# Patient Record
Sex: Male | Born: 1970 | Race: Black or African American | Hispanic: No | Marital: Single | Smoking: Current every day smoker
Health system: Southern US, Community
[De-identification: ages and names within clinical notes are randomized; demographics above are authoritative.]

## PROBLEM LIST (undated history)

## (undated) DIAGNOSIS — K2270 Barrett's esophagus without dysplasia: Secondary | ICD-10-CM

## (undated) DIAGNOSIS — K2100 Gastro-esophageal reflux disease with esophagitis, without bleeding: Secondary | ICD-10-CM

## (undated) DIAGNOSIS — B9681 Helicobacter pylori [H. pylori] as the cause of diseases classified elsewhere: Secondary | ICD-10-CM

## (undated) DIAGNOSIS — K2970 Gastritis, unspecified, without bleeding: Secondary | ICD-10-CM

## (undated) DIAGNOSIS — R569 Unspecified convulsions: Secondary | ICD-10-CM

## (undated) DIAGNOSIS — I10 Essential (primary) hypertension: Secondary | ICD-10-CM

## (undated) DIAGNOSIS — K219 Gastro-esophageal reflux disease without esophagitis: Secondary | ICD-10-CM

## (undated) HISTORY — DX: Gastro-esophageal reflux disease with esophagitis, without bleeding: K21.00

## (undated) HISTORY — DX: Essential (primary) hypertension: I10

## (undated) HISTORY — DX: Unspecified convulsions: R56.9

## (undated) HISTORY — DX: Barrett's esophagus without dysplasia: K22.70

## (undated) HISTORY — DX: Gastritis, unspecified, without bleeding: K29.70

## (undated) HISTORY — DX: Helicobacter pylori (H. pylori) as the cause of diseases classified elsewhere: B96.81

## (undated) HISTORY — DX: Gastro-esophageal reflux disease with esophagitis: K21.0

---

## 2010-09-01 ENCOUNTER — Encounter: Payer: Self-pay | Admitting: *Deleted

## 2010-09-01 ENCOUNTER — Emergency Department (HOSPITAL_COMMUNITY): Payer: BC Managed Care – PPO

## 2010-09-01 ENCOUNTER — Emergency Department (HOSPITAL_COMMUNITY)
Admission: EM | Admit: 2010-09-01 | Discharge: 2010-09-01 | Disposition: A | Discharge status: Home / Self Care | Payer: BC Managed Care – PPO | Attending: Emergency Medicine | Admitting: Emergency Medicine

## 2010-09-01 DIAGNOSIS — R109 Unspecified abdominal pain: Secondary | ICD-10-CM | POA: Insufficient documentation

## 2010-09-01 DIAGNOSIS — F17200 Nicotine dependence, unspecified, uncomplicated: Secondary | ICD-10-CM | POA: Insufficient documentation

## 2010-09-01 DIAGNOSIS — R1031 Right lower quadrant pain: Secondary | ICD-10-CM | POA: Insufficient documentation

## 2010-09-01 DIAGNOSIS — R197 Diarrhea, unspecified: Secondary | ICD-10-CM | POA: Insufficient documentation

## 2010-09-01 DIAGNOSIS — K625 Hemorrhage of anus and rectum: Secondary | ICD-10-CM | POA: Insufficient documentation

## 2010-09-01 LAB — COMPREHENSIVE METABOLIC PANEL
ALT: 12 U/L (ref 0–53)
AST: 19 U/L (ref 0–37)
Albumin: 4 g/dL (ref 3.5–5.2)
Alkaline Phosphatase: 96 U/L (ref 39–117)
BUN: 11 mg/dL (ref 6–23)
Potassium: 3.7 mEq/L (ref 3.5–5.1)
Sodium: 136 mEq/L (ref 135–145)
Total Protein: 7.2 g/dL (ref 6.0–8.3)

## 2010-09-01 LAB — CBC
HCT: 45 % (ref 39.0–52.0)
MCV: 96.6 fL (ref 78.0–100.0)
RDW: 12.7 % (ref 11.5–15.5)
WBC: 5.2 10*3/uL (ref 4.0–10.5)

## 2010-09-01 LAB — URINALYSIS, ROUTINE W REFLEX MICROSCOPIC
Glucose, UA: NEGATIVE mg/dL
Hgb urine dipstick: NEGATIVE
Protein, ur: NEGATIVE mg/dL
Specific Gravity, Urine: 1.015 (ref 1.005–1.030)

## 2010-09-01 MED ORDER — ONDANSETRON HCL 4 MG/2ML IJ SOLN
4.0000 mg | Freq: Once | INTRAMUSCULAR | Status: AC
  Administered 2010-09-01: 4 mg via INTRAVENOUS
  Filled 2010-09-01: qty 2

## 2010-09-01 MED ORDER — IOHEXOL 300 MG/ML  SOLN
100.0000 mL | Freq: Once | INTRAMUSCULAR | Status: AC | PRN
  Administered 2010-09-01: 100 mL via INTRAVENOUS

## 2010-09-01 MED ORDER — SODIUM CHLORIDE 0.9 % IV BOLUS (SEPSIS)
250.0000 mL | Freq: Once | INTRAVENOUS | Status: AC
  Administered 2010-09-01: 250 mL via INTRAVENOUS

## 2010-09-01 MED ORDER — SODIUM CHLORIDE 0.9 % IV SOLN: INTRAVENOUS | Status: DC

## 2010-09-01 NOTE — ED Notes (Signed)
Pt c/o blood in his stool, started a week ago, has gotten worse,  Pt describes color of blood as bright red most of the time sometimes it seems dark in color as well. Denies any rectal pain, states that he feels more like diarrhea.

## 2010-09-01 NOTE — ED Notes (Signed)
Pt also c/o pain to right abd/side area

## 2010-09-01 NOTE — ED Notes (Signed)
Pt given urinal. Encouraged to void

## 2010-09-01 NOTE — ED Provider Notes (Signed)
History     CSN: [id number] Arrival date & time: 09/01/2010  2:55 PM Scribed for Shelda Jakes, MD, the patient was seen in room APA03/APA03. This chart was scribed by Katha Cabal. This patient's care was started at 3:25PM.   Chief Complaint  Patient presents with  . Rectal Bleeding   HPI  Jonathon Guerrero is a 40 y.o. male who presents to the Emergency Department complaining of rectal bleeding onset 2 weeks ago with associated diarrhea, suprapubic and RLQ abdominal discomfort described as soreness.  Pain is rated 4/10 currently and 9/10 at worse. Pt has noticed that the water in the stool has been turned red a few times. Denies cough, fever, nausea, SOB, chest pain, peripheral edema, rash, headache,    HPI ELEMENTS:  Location: rectal  Onset: 2 weeks ago Duration: gradually getting worse  Timing: intermittent,  Severity: 4/10, 9/10 Context:  as above  Associated symptoms:  diarrhea, suprapubic and RLQ abdominal discomfort described as soreness. Denies cough, fever, nausea, SOB, chest pain, peripheral edema, rash, headache,    PAST MEDICAL HISTORY:  History reviewed. No pertinent past medical history.  PAST SURGICAL HISTORY:  History reviewed. No pertinent past surgical history.  MEDICATIONS:  Previous Medications   No medications on file     ALLERGIES:  Allergies as of 09/01/2010  . (No Known Allergies)     FAMILY HISTORY:  No family history on file.   SOCIAL HISTORY: History   Social History  . Marital Status: Single    Spouse Name: N/A    Number of Children: N/A  . Years of Education: N/A   Social History Main Topics  . Smoking status: Current Everyday Smoker -- 0.5 packs/day  . Smokeless tobacco: None  . Alcohol Use: No  . Drug Use: No  . Sexually Active:    Other Topics Concern  . None   Social History Narrative  . None      Review of Systems 10 Systems reviewed and are negative for acute change except as noted in the HPI.  Physical  Exam  Ht 5\' 7"  (1.702 m)  Wt 229 lb (103.874 kg)  BMI 35.87 kg/m2  Physical Exam  Nursing note and vitals reviewed. Constitutional: He is oriented to person, place, and time. He appears well-developed and well-nourished.  HENT:  Head: Normocephalic and atraumatic.  Eyes: Pupils are equal, round, and reactive to light.  Neck: Normal range of motion. Neck supple.  Cardiovascular: Normal rate and regular rhythm.  Exam reveals no gallop and no friction rub.   No murmur heard. Pulmonary/Chest: Effort normal and breath sounds normal. No respiratory distress. He has no wheezes. He has no rales.  Abdominal: Soft. Bowel sounds are normal. There is no tenderness.  Genitourinary:       No hemorrhoids, no external fissure.    Musculoskeletal: Normal range of motion. He exhibits no edema.  Lymphadenopathy:    He has no cervical adenopathy.  Neurological: He is alert and oriented to person, place, and time. No cranial nerve deficit or sensory deficit.  Skin: Skin is warm and dry.  Psychiatric: He has a normal mood and affect. His behavior is normal.    ED Course  Procedures OTHER DATA REVIEWED: Nursing notes, vital signs, and past medical records reviewed.   LABS / RADIOLOGY:  No results found for this or any previous visit.  Results for orders placed during the hospital encounter of 09/01/10  COMPREHENSIVE METABOLIC PANEL      Component Value  Range   Sodium 136  135 - 145 (mEq/L)   Potassium 3.7  3.5 - 5.1 (mEq/L)   Chloride 100  96 - 112 (mEq/L)   CO2 28  19 - 32 (mEq/L)   Glucose, Bld 94  70 - 99 (mg/dL)   BUN 11  6 - 23 (mg/dL)   Creatinine, Ser 1.61  0.50 - 1.35 (mg/dL)   Calcium 8.8  8.4 - 09.6 (mg/dL)   Total Protein 7.2  6.0 - 8.3 (g/dL)   Albumin 4.0  3.5 - 5.2 (g/dL)   AST 19  0 - 37 (U/L)   ALT 12  0 - 53 (U/L)   Alkaline Phosphatase 96  39 - 117 (U/L)   Total Bilirubin 0.6  0.3 - 1.2 (mg/dL)   GFR calc non Af Amer >60  >60 (mL/min)   GFR calc Af Amer >60  >60  (mL/min)  CBC      Component Value Range   WBC 5.2  4.0 - 10.5 (K/uL)   RBC 4.66  4.22 - 5.81 (MIL/uL)   Hemoglobin 15.6  13.0 - 17.0 (g/dL)   HCT 04.5  40.9 - 81.1 (%)   MCV 96.6  78.0 - 100.0 (fL)   MCH 33.5  26.0 - 34.0 (pg)   MCHC 34.7  30.0 - 36.0 (g/dL)   RDW 91.4  78.2 - 95.6 (%)   Platelets 211  150 - 400 (K/uL)  LIPASE, BLOOD      Component Value Range   Lipase 41  11 - 59 (U/L)  URINALYSIS, ROUTINE W REFLEX MICROSCOPIC      Component Value Range   Color, Urine YELLOW  YELLOW    Appearance CLEAR  CLEAR    Specific Gravity, Urine 1.015  1.005 - 1.030    pH 8.5 (*) 5.0 - 8.0    Glucose, UA NEGATIVE  NEGATIVE (mg/dL)   Hgb urine dipstick NEGATIVE  NEGATIVE    Bilirubin Urine NEGATIVE  NEGATIVE    Ketones, ur NEGATIVE  NEGATIVE (mg/dL)   Protein, ur NEGATIVE  NEGATIVE (mg/dL)   Urobilinogen, UA 1.0  0.0 - 1.0 (mg/dL)   Nitrite NEGATIVE  NEGATIVE    Leukocytes, UA NEGATIVE  NEGATIVE    Results for orders placed during the hospital encounter of 09/01/10  COMPREHENSIVE METABOLIC PANEL      Component Value Range   Sodium 136  135 - 145 (mEq/L)   Potassium 3.7  3.5 - 5.1 (mEq/L)   Chloride 100  96 - 112 (mEq/L)   CO2 28  19 - 32 (mEq/L)   Glucose, Bld 94  70 - 99 (mg/dL)   BUN 11  6 - 23 (mg/dL)   Creatinine, Ser 2.13  0.50 - 1.35 (mg/dL)   Calcium 8.8  8.4 - 08.6 (mg/dL)   Total Protein 7.2  6.0 - 8.3 (g/dL)   Albumin 4.0  3.5 - 5.2 (g/dL)   AST 19  0 - 37 (U/L)   ALT 12  0 - 53 (U/L)   Alkaline Phosphatase 96  39 - 117 (U/L)   Total Bilirubin 0.6  0.3 - 1.2 (mg/dL)   GFR calc non Af Amer >60  >60 (mL/min)   GFR calc Af Amer >60  >60 (mL/min)  CBC      Component Value Range   WBC 5.2  4.0 - 10.5 (K/uL)   RBC 4.66  4.22 - 5.81 (MIL/uL)   Hemoglobin 15.6  13.0 - 17.0 (g/dL)   HCT 57.8  46.9 -  52.0 (%)   MCV 96.6  78.0 - 100.0 (fL)   MCH 33.5  26.0 - 34.0 (pg)   MCHC 34.7  30.0 - 36.0 (g/dL)   RDW 45.4  09.8 - 11.9 (%)   Platelets 211  150 - 400 (K/uL)    LIPASE, BLOOD      Component Value Range   Lipase 41  11 - 59 (U/L)  URINALYSIS, ROUTINE W REFLEX MICROSCOPIC      Component Value Range   Color, Urine YELLOW  YELLOW    Appearance CLEAR  CLEAR    Specific Gravity, Urine 1.015  1.005 - 1.030    pH 8.5 (*) 5.0 - 8.0    Glucose, UA NEGATIVE  NEGATIVE (mg/dL)   Hgb urine dipstick NEGATIVE  NEGATIVE    Bilirubin Urine NEGATIVE  NEGATIVE    Ketones, ur NEGATIVE  NEGATIVE (mg/dL)   Protein, ur NEGATIVE  NEGATIVE (mg/dL)   Urobilinogen, UA 1.0  0.0 - 1.0 (mg/dL)   Nitrite NEGATIVE  NEGATIVE    Leukocytes, UA NEGATIVE  NEGATIVE    Ct Abdomen Pelvis W Contrast  09/01/2010  *RADIOLOGY REPORT*  Clinical Data: 40 year old male with right lower quadrant pain, diarrhea, bloody stools.  CT ABDOMEN AND PELVIS WITH CONTRAST  Technique:  Multidetector CT imaging of the abdomen and pelvis was performed following the standard protocol during bolus administration of intravenous contrast.  Contrast: 100 ml Omnipaque-300.  Comparison: None.  Findings: Clear lung bases. No osseous abnormality identified.  No pelvic free fluid.  The rectum is unremarkable.  There is mild sigmoid wall thickening but no sigmoid mesenteric stranding (series 2 image 67).  Decompressed bladder.  No pelvic or inguinal lymphadenopathy.  Negative descending colon.  Negative transverse and right colon. No dilated small bowel.  There are are several prominent loops in the left upper quadrant which may be mildly thickened, but no adjacent mesenteric stranding.  A small fat containing hiatal hernia, otherwise negative stomach.  Duodenum also may be mildly thickened but without adjacent stranding. Negative liver, gallbladder, spleen, pancreas, adrenal glands, kidneys, and portal venous system.  Negative major arterial structures except for intermittent calcified atherosclerosis.  No abdominal free fluid or lymphadenopathy.  IMPRESSION: Negative CT of the abdomen pelvis except for questionable  intermittent wall thickening in the sigmoid colon and proximal small bowel.  No associated mesenteric fluid or inflammation, but mild enterocolitis is not excluded.  Original Report Authenticated By: Harley Hallmark, M.D.     No results found.  ED COURSE / COORDINATION OF CARE:  Orders Placed This Encounter  Procedures  . CT Abdomen Pelvis W Contrast  . Comprehensive metabolic panel  . CBC  . Lipase, blood  . Urinalysis with microscopic    MDM:  LABS AND CT NEGATIVE WILL NEED GI WORK UP WIT SCOPE NO SIG BLOOD LOSS. VITAL SIGN NL IN ED.    IMPRESSION: Diagnoses that have been ruled out:  Diagnoses that are still under consideration:  Final diagnoses:    PLAN:   The patient is to return the emergency department if there is any worsening of symptoms. I have reviewed the discharge instructions with the PATIENT   CONDITION ON DISCHARGE: STABLE.    MEDICATIONS GIVEN IN THE E.D. Scheduled Meds:   . ondansetron  4 mg Intravenous Once  . sodium chloride  250 mL Intravenous Once   Continuous Infusions:   . sodium chloride        DISCHARGE MEDICATIONS: New Prescriptions   No medications  on file     I personally performed the services described in this documentation, which was scribed in my presence. The recorded information has been reviewed and considered. Shelda Jakes, MD          Shelda Jakes, MD 09/01/10 [phone]

## 2010-09-04 DIAGNOSIS — K2970 Gastritis, unspecified, without bleeding: Secondary | ICD-10-CM

## 2010-09-04 DIAGNOSIS — B9681 Helicobacter pylori [H. pylori] as the cause of diseases classified elsewhere: Secondary | ICD-10-CM

## 2010-09-04 HISTORY — DX: Helicobacter pylori (H. pylori) as the cause of diseases classified elsewhere: B96.81

## 2010-09-04 HISTORY — PX: COLONOSCOPY: SHX174

## 2010-09-04 HISTORY — DX: Gastritis, unspecified, without bleeding: K29.70

## 2010-09-04 HISTORY — PX: ESOPHAGOGASTRODUODENOSCOPY: SHX1529

## 2010-09-07 ENCOUNTER — Ambulatory Visit (INDEPENDENT_AMBULATORY_CARE_PROVIDER_SITE_OTHER): Payer: Self-pay | Admitting: Urgent Care

## 2010-09-07 ENCOUNTER — Encounter: Payer: Self-pay | Admitting: Urgent Care

## 2010-09-07 VITALS — BP 158/91 | HR 88 | Temp 98.3°F | Ht 67.0 in | Wt 225.2 lb

## 2010-09-07 DIAGNOSIS — K219 Gastro-esophageal reflux disease without esophagitis: Secondary | ICD-10-CM | POA: Insufficient documentation

## 2010-09-07 DIAGNOSIS — K625 Hemorrhage of anus and rectum: Secondary | ICD-10-CM

## 2010-09-07 MED ORDER — OMEPRAZOLE 20 MG PO CPDR: 20.0000 mg | DELAYED_RELEASE_CAPSULE | Freq: Every day | ORAL | Status: DC

## 2010-09-07 NOTE — Assessment & Plan Note (Signed)
Jonathon Guerrero is a 40 y.o. black male w/ 1 month hx of intermittent rectal bleeding that varies from dark to bright red.  He also has abdominal pain & GERD symptoms along w/ daily alcohol use.  His hemoglobin is normal making rapid transit upper GI bleed less likely. I suspect he has lower GI source and he will need colonoscopy to look for diverticular bleeding, ischemia, or colorectal carcinoma.  Colonoscopy with Dr. Jena Gauss in the near future.  Procedure will need to be done with deep sedation (propofol) in the OR under the direction of anesthesia services for chronic heavy alcohol use.  FMLA paperwork completed for patient.

## 2010-09-07 NOTE — Assessment & Plan Note (Signed)
Daily GERD symptoms in a patient with significant alcohol intake. Will begin PPI in the way of omeprazole 20 mg daily.  Avoid alcohol. I suspect he could have alcohol-induced gastritis or esophagitis.  PUD less likely.  EGD at the same time as colonoscopy if no source of lower GI bleed.

## 2010-09-07 NOTE — Patient Instructions (Addendum)
22 Jonathon Guerrero  09/07/2010   Your procedure is scheduled on:  09/09/2010  Report to [facility] at  730  AM.  Call this number if you have problems the morning of surgery: [phone]   Remember:   Do not eat food:After Midnight.  Do not drink clear liquids: After Midnight.  Take these medicines the morning of surgery with A SIP OF WATER: omeprazole   Do not wear jewelry, make-up or nail polish.  Do not wear lotions, powders, or perfumes. You may wear deodorant.  Do not shave 48 hours prior to surgery.  Do not bring valuables to the hospital.  Contacts, dentures or bridgework may not be worn into surgery.  Leave suitcase in the car. After surgery it may be brought to your room.  For patients admitted to the hospital, checkout time is 11:00 AM the day of discharge.   Patients discharged the day of surgery will not be allowed to drive home.  Name and phone number of your driver: family  Special Instructions: N/A   Please read over the following fact sheets that you were given: Pain Booklet, Surgical Site Infection Prevention, Anesthesia Post-op Instructions and Care and Recovery After Surgery PATIENT INSTRUCTIONS POST-ANESTHESIA  IMMEDIATELY FOLLOWING SURGERY:  Do not drive or operate machinery for the first twenty four hours after surgery.  Do not make any important decisions for twenty four hours after surgery or while taking narcotic pain medications or sedatives.  If you develop intractable nausea and vomiting or a severe headache please notify your doctor immediately.  FOLLOW-UP:  Please make an appointment with your surgeon as instructed. You do not need to follow up with anesthesia unless specifically instructed to do so.  WOUND CARE INSTRUCTIONS (if applicable):  Keep a dry clean dressing on the anesthesia/puncture wound site if there is drainage.  Once the wound has quit draining you may leave it open to air.  Generally you should leave the bandage intact for twenty four hours  unless there is drainage.  If the epidural site drains for more than 36-48 hours please call the anesthesia department.  QUESTIONS?:  Please feel free to call your physician or the hospital operator if you have any questions, and they will be happy to assist you.     [facility] Anesthesia Department [address] [place] [phone]

## 2010-09-07 NOTE — Progress Notes (Signed)
Primary Care Physician:  n/a Primary Gastroenterologist:  Dr. Jena Gauss  Chief Complaint  Patient presents with  . Abdominal Pain  . Rectal Bleeding    HPI:  Jonathon Guerrero is a 40 y.o. male here as a new patient for evaluation of rectal bleeding.  He reports an ER visit last week.  1 mo ago he noticed bright & dark red bleeding intermittently every day.  Denies pruritis or hemorrhoids.  C/o burning mid-abd.  Denies n/v.  C/o heartburn daily he is taking tums/rolaids which helps a little.  Denies NSAIDs. Does report alcohol use heavily more days of the week than not. BM 2-4/day.  C/o tenesmus.  App ok.  Wt stable.  Denies constipation or straining.    Lab Summary Latest Ref Rng 09/01/2010  Hemoglobin 13.0 - 17.0 g/dL 96.0  Hematocrit 45.4 - 52.0 % 45.0  White count 4.0 - 10.5 K/uL 5.2  Platelet count 150 - 400 K/uL 211  Sodium 135 - 145 mEq/L 136  Potassium 3.5 - 5.1 mEq/L 3.7  Calcium 8.4 - 10.5 mg/dL 8.8  Phosphorus  (None)  Creatinine 0.50 - 1.35 mg/dL 0.98  AST 0 - 37 U/L 19  Alk Phos 39 - 117 U/L 96  Bilirubin 0.3 - 1.2 mg/dL 0.6  Glucose 70 - 99 mg/dL 94  Cholesterol  (None)  HDL cholesterol  (None)  Triglycerides  (None)  LDL calc  (None)  LDL direct  (None)  Total protein 6.0 - 8.3 g/dL 7.2  Albumin 3.5 - 5.2 g/dL 4.0   Past Medical History:  Denies any  Past Surgical History:  Denies any  Current Outpatient Prescriptions  Medication Sig Dispense Refill  . omeprazole (PRILOSEC) 20 MG capsule Take 1 capsule (20 mg total) by mouth daily.  31 capsule  1   Allergies as of 09/07/2010 - Review Complete 09/07/2010  Allergen Reaction Noted  . Sardine flavor  09/01/2010  . Poison sumac extract Itching, Swelling, and Rash 09/01/2010   Family History: There is no known family history of colorectal carcinoma , liver disease, or inflammatory bowel disease.  Problem Relation Age of Onset  . Lung cancer Father 39  . Aneurysm Mother 64  . Seizures Daughter    History    Social History  . Marital Status: Single    Spouse Name: N/A    Number of Children: 3  . Years of Education: N/A   Occupational History  . [employer]    Social History Main Topics  . Smoking status: Current Everyday Smoker -- 0.5 packs/day for 14 years    Types: Cigarettes  . Smokeless tobacco: Not on file  . Alcohol Use: Yes     heavy etoh liquor 3-5 days/wk  . Drug Use: Yes     hx marijuana 3 mo  . Sexually Active: Not on file   Other Topics Concern  . Not on file   Social History Narrative   Lives alone    Review of Systems: Gen: Denies any fever, chills, sweats, anorexia, fatigue, weakness, malaise, weight loss, and sleep disorder CV: Denies chest pain, angina, palpitations, syncope, orthopnea, PND, peripheral edema, and claudication. Resp: Denies dyspnea at rest, dyspnea with exercise, cough, sputum, wheezing, coughing up blood, and pleurisy. GI: Denies vomiting blood, jaundice, and fecal incontinence.   Denies dysphagia or odynophagia. GU : Denies urinary burning, blood in urine, urinary frequency, urinary hesitancy, nocturnal urination, and urinary incontinence. MS: Denies joint pain, limitation of movement, and swelling, stiffness, low back pain, extremity pain.  Denies muscle weakness, cramps, atrophy.  Derm: Denies rash, itching, dry skin, hives, moles, warts, or unhealing ulcers.  Psych: Denies depression, anxiety, memory loss, suicidal ideation, hallucinations, paranoia, and confusion. Heme: Denies bruising, bleeding, and enlarged lymph nodes.  Physical Exam: BP 158/91  Pulse 88  Temp(Src) 98.3 F (36.8 C) (Temporal)  Ht 5\' 7"  (1.702 m)  Wt 225 lb 3.2 oz (102.15 kg)  BMI 35.27 kg/m2 General:   Alert,  Well-developed, well-nourished, pleasant and cooperative in NAD Head:  Normocephalic and atraumatic. Eyes:  Sclera clear, no icterus.   Conjunctiva pink. Ears:  Normal auditory acuity. Nose:  No deformity, discharge,  or lesions. Mouth:  No deformity or  lesions, dentition normal. Neck:  Supple; no masses or thyromegaly. Lungs:  Clear throughout to auscultation.   No wheezes, crackles, or rhonchi. No acute distress. Heart:  Regular rate and rhythm; no murmurs, clicks, rubs,  or gallops. Abdomen:  Soft, nontender and nondistended. No masses, hepatosplenomegaly or hernias noted. Normal bowel sounds, without guarding, and without rebound.   Rectal:  Deferred until time of colonoscopy.   Msk:  Symmetrical without gross deformities. Normal posture. Pulses:  Normal pulses noted. Extremities:  Without clubbing or edema. Neurologic:  Alert and  oriented x4;  grossly normal neurologically. Skin:  Intact without significant lesions or rashes. Cervical Nodes:  No significant cervical adenopathy. Psych:  Alert and cooperative. Normal mood and affect.

## 2010-09-07 NOTE — Patient Instructions (Addendum)
NO ALCOHOL!!!!    Bloody Stools Blood in the poop (bloody stools) is a sign that there is a problem somewhere in the digestive system. The blood might be black, medium red, or bright red. You might also have pain, weakness, throwing up (vomiting), or fever. There are many things that can cause blood in the poop. It is important to figure out what is causing the bleeding. Your doctor might do some tests to figure out what is wrong. When your doctor knows why the bleeding is happening, you will get help to stop the bleeding. HOME CARE  Take medicines, as told by your doctor.   Eat food with lots of fiber (like prunes and bran cereals) and drink lots of liquids.   Sit in a bathtub with warm water for 10-15 minutes, to soak and clean the area, as told by your doctor.   Watch for signs that you are getting better or getting worse.  GET HELP RIGHT AWAY IF:  Your bleeding continues or gets worse.   You throw up (vomit) blood.   You feel weak or dizzy.   You have a temperature by mouth above 101, not controlled by medicine.   You have strong pain.  MAKE SURE YOU:   Understand these instructions.   Will watch your condition.   Will get help right away if you are not doing well or get worse.  Document Released: 12/08/2008 Document Re-Released: 01/11/2009 [facility] Patient Information 2011 [place], [place].

## 2010-09-07 NOTE — Progress Notes (Signed)
No PCP on file 

## 2010-09-08 ENCOUNTER — Encounter (HOSPITAL_COMMUNITY): Payer: Self-pay

## 2010-09-08 ENCOUNTER — Encounter (HOSPITAL_COMMUNITY)
Admission: RE | Admit: 2010-09-08 | Discharge: 2010-09-08 | Disposition: A | Discharge status: Home / Self Care | Payer: BC Managed Care – PPO | Source: Ambulatory Visit | Attending: Internal Medicine | Admitting: Internal Medicine

## 2010-09-08 ENCOUNTER — Other Ambulatory Visit (HOSPITAL_COMMUNITY): Payer: Self-pay

## 2010-09-08 HISTORY — DX: Gastro-esophageal reflux disease without esophagitis: K21.9

## 2010-09-09 ENCOUNTER — Ambulatory Visit (HOSPITAL_COMMUNITY): Payer: BC Managed Care – PPO | Admitting: Anesthesiology

## 2010-09-09 ENCOUNTER — Encounter (HOSPITAL_COMMUNITY): Payer: Self-pay | Admitting: Anesthesiology

## 2010-09-09 ENCOUNTER — Encounter (HOSPITAL_COMMUNITY): Payer: Self-pay | Admitting: *Deleted

## 2010-09-09 ENCOUNTER — Encounter (HOSPITAL_COMMUNITY)
Admission: RE | Disposition: A | Discharge status: Home / Self Care | Payer: Self-pay | Source: Ambulatory Visit | Attending: Internal Medicine

## 2010-09-09 ENCOUNTER — Ambulatory Visit (HOSPITAL_COMMUNITY)
Admission: RE | Admit: 2010-09-09 | Discharge: 2010-09-09 | Disposition: A | Discharge status: Home / Self Care | Payer: BC Managed Care – PPO | Source: Ambulatory Visit | Attending: Internal Medicine | Admitting: Internal Medicine

## 2010-09-09 ENCOUNTER — Other Ambulatory Visit: Payer: Self-pay | Admitting: Internal Medicine

## 2010-09-09 DIAGNOSIS — K921 Melena: Secondary | ICD-10-CM

## 2010-09-09 DIAGNOSIS — D126 Benign neoplasm of colon, unspecified: Secondary | ICD-10-CM

## 2010-09-09 DIAGNOSIS — K625 Hemorrhage of anus and rectum: Secondary | ICD-10-CM

## 2010-09-09 DIAGNOSIS — K228 Other specified diseases of esophagus: Secondary | ICD-10-CM

## 2010-09-09 DIAGNOSIS — K2940 Chronic atrophic gastritis without bleeding: Secondary | ICD-10-CM | POA: Insufficient documentation

## 2010-09-09 DIAGNOSIS — K648 Other hemorrhoids: Secondary | ICD-10-CM | POA: Insufficient documentation

## 2010-09-09 HISTORY — PX: POLYPECTOMY: SHX5525

## 2010-09-09 SURGERY — COLONOSCOPY WITH PROPOFOL
Anesthesia: Monitor Anesthesia Care

## 2010-09-09 MED ORDER — LACTATED RINGERS IV SOLN
INTRAVENOUS | Status: DC | PRN
  Administered 2010-09-09: 08:00:00 via INTRAVENOUS

## 2010-09-09 MED ORDER — ONDANSETRON HCL 4 MG/2ML IJ SOLN
4.0000 mg | Freq: Once | INTRAMUSCULAR | Status: AC
  Administered 2010-09-09: 4 mg via INTRAVENOUS

## 2010-09-09 MED ORDER — FENTANYL CITRATE 0.05 MG/ML IJ SOLN
INTRAMUSCULAR | Status: AC
  Filled 2010-09-09: qty 2

## 2010-09-09 MED ORDER — STERILE WATER FOR IRRIGATION IR SOLN
Status: DC | PRN
  Administered 2010-09-09: 09:00:00

## 2010-09-09 MED ORDER — PROPOFOL 10 MG/ML IV EMUL
INTRAVENOUS | Status: AC
  Filled 2010-09-09: qty 20

## 2010-09-09 MED ORDER — FENTANYL CITRATE 0.05 MG/ML IJ SOLN
INTRAMUSCULAR | Status: DC | PRN
  Administered 2010-09-09 (×2): 50 ug via INTRAVENOUS

## 2010-09-09 MED ORDER — MIDAZOLAM HCL 2 MG/2ML IJ SOLN
INTRAMUSCULAR | Status: AC
  Filled 2010-09-09: qty 2

## 2010-09-09 MED ORDER — PANTOPRAZOLE SODIUM 40 MG PO TBEC: 40.0000 mg | DELAYED_RELEASE_TABLET | Freq: Every day | ORAL | Status: DC

## 2010-09-09 MED ORDER — LACTATED RINGERS IV SOLN: INTRAVENOUS | Status: DC

## 2010-09-09 MED ORDER — PROPOFOL 10 MG/ML IV EMUL
INTRAVENOUS | Status: DC | PRN
  Administered 2010-09-09: 100 ug/kg/min via INTRAVENOUS

## 2010-09-09 MED ORDER — GLYCOPYRROLATE 0.2 MG/ML IJ SOLN
INTRAMUSCULAR | Status: AC
  Filled 2010-09-09: qty 1

## 2010-09-09 MED ORDER — PANTOPRAZOLE SODIUM 40 MG PO TBEC: 40.0000 mg | DELAYED_RELEASE_TABLET | Freq: Two times a day (BID) | ORAL | Status: DC

## 2010-09-09 MED ORDER — HYDROCORTISONE ACETATE 25 MG RE SUPP: 25.0000 mg | RECTAL | Status: DC

## 2010-09-09 MED ORDER — WATER FOR IRRIGATION, STERILE IR SOLN
Status: DC | PRN
  Administered 2010-09-09: 1000 mL

## 2010-09-09 MED ORDER — GLYCOPYRROLATE 0.2 MG/ML IJ SOLN
0.2000 mg | Freq: Once | INTRAMUSCULAR | Status: AC
Start: 2010-09-09 — End: 2010-09-09
  Administered 2010-09-09: 0.2 mg via INTRAVENOUS

## 2010-09-09 MED ORDER — ONDANSETRON HCL 4 MG/2ML IJ SOLN
INTRAMUSCULAR | Status: AC
  Filled 2010-09-09: qty 2

## 2010-09-09 MED ORDER — PROPOFOL 10 MG/ML IV EMUL
INTRAVENOUS | Status: DC | PRN
  Administered 2010-09-09: 30 mg via INTRAVENOUS
  Administered 2010-09-09: 50 mg via INTRAVENOUS

## 2010-09-09 MED ORDER — LACTATED RINGERS IV SOLN
INTRAVENOUS | Status: DC
  Administered 2010-09-09: 50 mL/h via INTRAVENOUS

## 2010-09-09 MED ORDER — MIDAZOLAM HCL 2 MG/2ML IJ SOLN
1.0000 mg | INTRAMUSCULAR | Status: DC | PRN
  Administered 2010-09-09 (×2): 2 mg via INTRAVENOUS

## 2010-09-09 MED ORDER — BUTAMBEN-TETRACAINE-BENZOCAINE 2-2-14 % EX AERO
1.0000 | INHALATION_SPRAY | Freq: Once | CUTANEOUS | Status: AC
  Administered 2010-09-09: 1 via TOPICAL
  Filled 2010-09-09: qty 56

## 2010-09-09 SURGICAL SUPPLY — 26 items
BLOCK BITE 60FR ADLT L/F BLUE (MISCELLANEOUS) ×3 IMPLANT
DEVICE CLIP HEMOSTAT 235CM (CLIP) IMPLANT
ELECT REM PT RETURN 9FT ADLT (ELECTROSURGICAL)
ELECTRODE REM PT RTRN 9FT ADLT (ELECTROSURGICAL) IMPLANT
FCP BXJMBJMB 240X2.8X (CUTTING FORCEPS)
FLOOR PAD 36X40 (MISCELLANEOUS) ×3
FORCEP RJ3 GP 1.8X160 W-NEEDLE (CUTTING FORCEPS) IMPLANT
FORCEPS BIOP RAD 4 LRG CAP 4 (CUTTING FORCEPS) ×3 IMPLANT
FORCEPS BIOP RJ4 240 W/NDL (CUTTING FORCEPS)
FORCEPS BXJMBJMB 240X2.8X (CUTTING FORCEPS) IMPLANT
INJECTOR/SNARE I SNARE (MISCELLANEOUS) IMPLANT
LUBRICANT JELLY 4.5OZ STERILE (MISCELLANEOUS) ×3 IMPLANT
MANIFOLD NEPTUNE II (INSTRUMENTS) ×3 IMPLANT
MANIFOLD NEPTUNE WASTE (CANNULA) ×3 IMPLANT
NEEDLE SCLEROTHERAPY 25GX240 (NEEDLE) IMPLANT
PAD FLOOR 36X40 (MISCELLANEOUS) ×2 IMPLANT
PROBE APC STR FIRE (PROBE) IMPLANT
PROBE INJECTION GOLD (MISCELLANEOUS)
PROBE INJECTION GOLD 7FR (MISCELLANEOUS) IMPLANT
SNARE ROTATE MED OVAL 20MM (MISCELLANEOUS) IMPLANT
SYR 50ML LL SCALE MARK (SYRINGE) ×3 IMPLANT
TRAP SPECIMEN MUCOUS 40CC (MISCELLANEOUS) IMPLANT
TUBING ENDO SMARTCAP (MISCELLANEOUS) ×3 IMPLANT
TUBING ENDO SMARTCAP PENTAX (MISCELLANEOUS) ×3 IMPLANT
TUBING IRRIGATION ENDOGATOR (MISCELLANEOUS) ×3 IMPLANT
WATER STERILE IRR 1000ML POUR (IV SOLUTION) ×6 IMPLANT

## 2010-09-09 NOTE — Anesthesia Preprocedure Evaluation (Addendum)
Anesthesia Evaluation  Name, MR# and DOB Patient awake  General Assessment Comment  Reviewed: Allergy & Precautions, H&P , NPO status , Patient's Chart, lab work & pertinent test results  Airway Mallampati: III  Neck ROM: Full    Dental  (+) Poor Dentition, Chipped and Caps   Pulmonary    pulmonary exam normalPulmonary Exam Normal     Cardiovascular hypertension (borderline, no meds), Regular Normal    Neuro/Psych   GI/Hepatic/Renal        GERD    Rectal bleeding    Endo/Other    Abdominal   Musculoskeletal   Hematology   Peds  Reproductive/Obstetrics    Anesthesia Other Findings             Anesthesia Physical Anesthesia Plan  ASA: II  Anesthesia Plan: MAC   Post-op Pain Management:    Induction: Intravenous  Airway Management Planned: Simple Face Mask  Additional Equipment:   Intra-op Plan:   Post-operative Plan:   Informed Consent: I have reviewed the patients History and Physical, chart, labs and discussed the procedure including the risks, benefits and alternatives for the proposed anesthesia with the patient or authorized representative who has indicated his/her understanding and acceptance.     Plan Discussed with:   Anesthesia Plan Comments:         Anesthesia Quick Evaluation

## 2010-09-09 NOTE — Anesthesia Postprocedure Evaluation (Signed)
  Anesthesia Post-op Note  Patient: Jonathon Guerrero  Procedure(s) Performed:  COLONOSCOPY WITH PROPOFOL - in cecum at 0916 with total withdrawal time =12 minutes; Colonoscopy with Polypectomy completed at 0928; ESOPHAGOGASTRODUODENOSCOPY (EGD) WITH PROPOFOL - EGD start at 0935  Patient Location: PACU  Anesthesia Type: MAC  Level of Consciousness: awake, alert  and oriented  Airway and Oxygen Therapy: Patient Spontanous Breathing  Post-op Pain: none  Post-op Assessment: Post-op Vital signs reviewed, Patient's Cardiovascular Status Stable, Respiratory Function Stable and No signs of Nausea or vomiting  Post-op Vital Signs: stable  Complications: No apparent anesthesia complications

## 2010-09-09 NOTE — Transfer of Care (Signed)
Immediate Anesthesia Transfer of Care Note  Patient: Jonathon Guerrero  Procedure(s) Performed:  COLONOSCOPY WITH PROPOFOL - in cecum at 0916 with total withdrawal time =12 minutes; Colonoscopy with Polypectomy completed at 0928; ESOPHAGOGASTRODUODENOSCOPY (EGD) WITH PROPOFOL - EGD start at 0935  Patient Location: PACU  Anesthesia Type: MAC  Level of Consciousness: awake, alert  and oriented  Airway & Oxygen Therapy: Patient Spontanous Breathing  Post-op Assessment: Report given to PACU RN  Post vital signs: stable  Complications: No apparent anesthesia complications

## 2010-09-09 NOTE — H&P (Signed)
[name]   09/07/2010 1:30 PM Office Visit  MRN: [id number]   Description: 40 year old male  Provider: Lorenza Burton, NP  Department: [facility]        Diagnoses     Rectal bleeding   - Primary    569.3    GERD (gastroesophageal reflux disease)     530.81      Reason for Visit     Abdominal Pain    Rectal Bleeding        Current Vitals       Recorded User        09/07/2010  1:32 PM  Ginger L Walker, NT           BP Pulse Temp (Src) Resp Ht Wt    158/91  88  98.3 F (36.8 C) (Temporal)  N/A  5\' 7"  (1.702 m)  225 lb 3.2 oz (102.15 kg)       BMI SpO2 PF    35.27 kg/m2  N/A  N/A          Progress Notes     Lorenza Burton, NP  09/07/2010  2:52 PM  Signed Primary Care Physician:  n/a Primary Gastroenterologist:  Dr. Jena Gauss    Chief Complaint   Patient presents with   .  Abdominal Pain   .  Rectal Bleeding      HPI:  Jonathon Guerrero is a 40 y.o. male here as a new patient for evaluation of rectal bleeding.  He reports an ER visit last week.  1 mo ago he noticed bright & dark red bleeding intermittently every day.  Denies pruritis or hemorrhoids.  C/o burning mid-abd.  Denies n/v.  C/o heartburn daily he is taking tums/rolaids which helps a little.  Denies NSAIDs. Does report alcohol use heavily more days of the week than not. BM 2-4/day.  C/o tenesmus.  App ok.  Wt stable.  Denies constipation or straining.      Lab Summary  Latest Ref Rng  09/01/2010   Hemoglobin  13.0 - 17.0 g/dL  30.8   Hematocrit  65.7 - 52.0 %  45.0   White count  4.0 - 10.5 K/uL  5.2   Platelet count  150 - 400 K/uL  211   Sodium  135 - 145 mEq/L  136   Potassium  3.5 - 5.1 mEq/L  3.7   Calcium  8.4 - 10.5 mg/dL  8.8   Phosphorus    (None)   Creatinine  0.50 - 1.35 mg/dL  8.46   AST  0 - 37 U/L  19   Alk Phos  39 - 117 U/L  96   Bilirubin  0.3 - 1.2 mg/dL  0.6   Glucose  70 - 99 mg/dL  94   Cholesterol    (None)   HDL cholesterol    (None)   Triglycerides    (None)     LDL calc    (None)   LDL direct    (None)   Total protein  6.0 - 8.3 g/dL  7.2   Albumin  3.5 - 5.2 g/dL  4.0      Past Medical History:  Denies any   Past Surgical History:  Denies any    Current Outpatient Prescriptions   Medication  Sig  Dispense  Refill   .  omeprazole (PRILOSEC) 20 MG capsule  Take 1 capsule (20 mg total) by mouth daily.   31 capsule   1  Allergies as of 09/07/2010 - Review Complete 09/07/2010   Allergen  Reaction  Noted   .  Sardine flavor    09/01/2010   .  Poison sumac extract  Itching, Swelling, and Rash  09/01/2010    Family History: There is no known family history of colorectal carcinoma , liver disease, or inflammatory bowel disease.   Problem  Relation  Age of Onset   .  Lung cancer  Father  17   .  Aneurysm  Mother  74   .  Seizures  Daughter         History       Social History   .  Marital Status:  Single       Spouse Name:  N/A       Number of Children:  3   .  Years of Education:  N/A       Occupational History   .  [employer]         Social History Main Topics   .  Smoking status:  Current Everyday Smoker -- 0.5 packs/day for 14 years       Types:  Cigarettes   .  Smokeless tobacco:  Not on file   .  Alcohol Use:  Yes         heavy etoh liquor 3-5 days/wk   .  Drug Use:  Yes         hx marijuana 3 mo   .  Sexually Active:  Not on file       Other Topics  Concern   .  Not on file       Social History Narrative     Lives alone      Review of Systems: Gen: Denies any fever, chills, sweats, anorexia, fatigue, weakness, malaise, weight loss, and sleep disorder CV: Denies chest pain, angina, palpitations, syncope, orthopnea, PND, peripheral edema, and claudication. Resp: Denies dyspnea at rest, dyspnea with exercise, cough, sputum, wheezing, coughing up blood, and pleurisy. GI: Denies vomiting blood, jaundice, and fecal incontinence.   Denies dysphagia or odynophagia. GU : Denies urinary burning, blood in urine,  urinary frequency, urinary hesitancy, nocturnal urination, and urinary incontinence. MS: Denies joint pain, limitation of movement, and swelling, stiffness, low back pain, extremity pain. Denies muscle weakness, cramps, atrophy.   Derm: Denies rash, itching, dry skin, hives, moles, warts, or unhealing ulcers.   Psych: Denies depression, anxiety, memory loss, suicidal ideation, hallucinations, paranoia, and confusion. Heme: Denies bruising, bleeding, and enlarged lymph nodes.   Physical Exam: BP 158/91  Pulse 88  Temp(Src) 98.3 F (36.8 C) (Temporal)  Ht 5\' 7"  (1.702 m)  Wt 225 lb 3.2 oz (102.15 kg)  BMI 35.27 kg/m2 General:   Alert,  Well-developed, well-nourished, pleasant and cooperative in NAD Head:  Normocephalic and atraumatic. Eyes:  Sclera clear, no icterus.   Conjunctiva pink. Ears:  Normal auditory acuity. Nose:  No deformity, discharge,  or lesions. Mouth:  No deformity or lesions, dentition normal. Neck:  Supple; no masses or thyromegaly. Lungs:  Clear throughout to auscultation.   No wheezes, crackles, or rhonchi. No acute distress. Heart:  Regular rate and rhythm; no murmurs, clicks, rubs,  or gallops. Abdomen:  Soft, nontender and nondistended. No masses, hepatosplenomegaly or hernias noted. Normal bowel sounds, without guarding, and without rebound.    Rectal:  Deferred until time of colonoscopy.    Msk:  Symmetrical without gross deformities. Normal posture. Pulses:  Normal pulses noted. Extremities:  Without  clubbing or edema. Neurologic:  Alert and  oriented x4;  grossly normal neurologically. Skin:  Intact without significant lesions or rashes. Cervical Nodes:  No significant cervical adenopathy. Psych:  Alert and cooperative. Normal mood and affect.     Gl[place]ora Score  09/07/2010  2:55 PM  Signed No PCP on file        Rectal bleeding - Lorenza Burton, NP  09/07/2010  2:50 PM  Signed Jonathon Guerrero is a 40 y.o. black male w/ 1 month hx of intermittent rectal  bleeding that varies from dark to bright red.  He also has abdominal pain & GERD symptoms along w/ daily alcohol use.  His hemoglobin is normal making rapid transit upper GI bleed less likely. I suspect he has lower GI source and he will need colonoscopy to look for diverticular bleeding, ischemia, or colorectal carcinoma.  Colonoscopy with Dr. Jena Gauss in the near future.  Procedure will need to be done with deep sedation (propofol) in the OR under the direction of anesthesia services for chronic heavy alcohol use.   FMLA paperwork completed for patient.  GERD (gastroesophageal reflux disease) Lorenza Burton, NP  09/07/2010  2:51 PM  Signed Daily GERD symptoms in a patient with significant alcohol intake. Will begin PPI in the way of omeprazole 20 mg daily.  Avoid alcohol. I suspect he could have alcohol-induced gastritis or esophagitis.  PUD less likely.  I have seen the patient prior to the procedure(s) today and reviewed the history and physical / consultation from 09/07/10.  There have been no changes. After consideration of the risks, benefits, alternatives and imponderables, the patient has consented to the procedure(s).

## 2010-09-10 MED FILL — Water For Irrigation, Sterile Irrigation Soln: Qty: 1000 | Status: AC

## 2010-09-13 ENCOUNTER — Telehealth: Payer: Self-pay

## 2010-09-13 NOTE — Telephone Encounter (Signed)
Pt called- he needs a letter written to [employer] stating that he has been out of work since 09/03/10. He is still having blood in his stool and some epigastric pain. Please advise.

## 2010-09-13 NOTE — Telephone Encounter (Signed)
Please give patient out of work note from 09/03/10 to 09/10/10 Be sure he is taking Protonix 40 mg twice a day Add Carafate 1 g QID, QS x 3wks, 0RF Anusol-HC suppositories 1 per rectum twice a day for 10 days, #10, 0RF Call if no better

## 2010-09-15 ENCOUNTER — Other Ambulatory Visit: Payer: Self-pay | Admitting: Urgent Care

## 2010-09-15 MED ORDER — AMOXICILL-CLARITHRO-LANSOPRAZ PO MISC: Freq: Two times a day (BID) | ORAL | Status: DC

## 2010-09-15 NOTE — Telephone Encounter (Signed)
Jonathon Guerrero states pt came by the office yesterday wanting BX results. Results reviewed.  HP + Will Rx: Prevpac Hold Prilosec while on treatment.

## 2010-09-15 NOTE — Telephone Encounter (Signed)
Pt aware, rx  Called to [facility].

## 2010-09-16 ENCOUNTER — Encounter (HOSPITAL_COMMUNITY): Payer: Self-pay | Admitting: Internal Medicine

## 2010-09-17 ENCOUNTER — Telehealth: Payer: Self-pay

## 2010-09-17 NOTE — Telephone Encounter (Signed)
Pt called this am. He is taking abx. Just got carafate yesterday. pts stomach started hurting again in the same place and he has blood in his stool and he is vomiting blood. No fever. Please advise.

## 2010-09-17 NOTE — Telephone Encounter (Signed)
Spoke w/ pt. Advised to go directly to ED for vomiting bright red blood this morning. Julie-please call & get update 1st thing Monday morning on pt to see if he needs OV He agrees w/ plan Thanks

## 2010-09-20 ENCOUNTER — Encounter: Payer: Self-pay | Admitting: Internal Medicine

## 2010-09-20 NOTE — Telephone Encounter (Signed)
Tried to call pt- NA 

## 2010-09-21 NOTE — Telephone Encounter (Signed)
Pt will be coming in for OV tomorrow at 2pm to see LSL

## 2010-09-21 NOTE — Telephone Encounter (Signed)
Spoke to pt- he is doing a little better, no more vomiting blood but he still has blood in his stool. His job wont let him come back to work while he is still bleeding. Stated he must be healed 100 % before he can come back. They have asked him to come back to [place] for an OV. Pt is also requesting OV and would like to be seen tomorrow. Darl Pikes, please schedule pt to come in. Thanks.

## 2010-09-22 ENCOUNTER — Encounter: Payer: Self-pay | Admitting: Gastroenterology

## 2010-09-22 ENCOUNTER — Ambulatory Visit (INDEPENDENT_AMBULATORY_CARE_PROVIDER_SITE_OTHER): Payer: BC Managed Care – PPO | Admitting: Gastroenterology

## 2010-09-22 VITALS — BP 156/95 | HR 91 | Temp 97.9°F | Ht 67.0 in | Wt 228.2 lb

## 2010-09-22 DIAGNOSIS — K625 Hemorrhage of anus and rectum: Secondary | ICD-10-CM

## 2010-09-22 DIAGNOSIS — B9681 Helicobacter pylori [H. pylori] as the cause of diseases classified elsewhere: Secondary | ICD-10-CM | POA: Insufficient documentation

## 2010-09-22 DIAGNOSIS — K2100 Gastro-esophageal reflux disease with esophagitis, without bleeding: Secondary | ICD-10-CM | POA: Insufficient documentation

## 2010-09-22 DIAGNOSIS — K920 Hematemesis: Secondary | ICD-10-CM

## 2010-09-22 DIAGNOSIS — A048 Other specified bacterial intestinal infections: Secondary | ICD-10-CM

## 2010-09-22 DIAGNOSIS — K2270 Barrett's esophagus without dysplasia: Secondary | ICD-10-CM | POA: Insufficient documentation

## 2010-09-22 DIAGNOSIS — K649 Unspecified hemorrhoids: Secondary | ICD-10-CM | POA: Insufficient documentation

## 2010-09-22 NOTE — Patient Instructions (Signed)
Avoid alcohol. Complete Prevpac. When complete, please start back your pantoprazole 40mg  twice daily. Take hemorrhoid suppository, 14 more days. Take 2 more weeks carafate. Call next Tuesday and let us know how your are doing. If you do not feel you are ready to go back to work, you will need to have another office visit to reassess.   Low-Fat, Low-Saturated-Fat, Low-Cholesterol Diets Food Selection Guide BREADS, CEREALS, PASTA, RICE, DRIED PEAS, AND BEANS These products are high in carbohydrates and most are low in fat. Therefore, they can be increased in the diet as substitutes for fatty foods. They too, however, contain calories and should not be eaten in excess. Cereals can be eaten for snacks as well as for breakfast.  Include foods that contain fiber (fruits, vegetables, whole grains, and legumes). Research shows that fiber may lower blood cholesterol levels, especially the water-soluble fiber found in fruits, vegetables, oat products, and legumes. FRUITS AND VEGETABLES It is good to eat fruits and vegetables. Besides being sources of fiber, both are rich in vitamins and some minerals. They help you get the daily allowances of these nutrients. Fruits and vegetables can be used for snacks and desserts. MEATS Limit lean meat, chicken, [place], and fish to no more than 6 ounces per day. Beef, Pork, and Lamb  Use lean cuts of beef, pork, and lamb. Lean cuts include:   Extra-lean ground beef.   Arm roast.   Sirloin tip.   Center-cut ham.   Round steak.   Loin chops.   Rump roast.   Tenderloin.  Trim all fat off the outside of meats before cooking. It is not necessary to severely decrease the intake of red meat, but lean choices should be made. Lean meat is rich in protein and contains a highly absorbable form of iron. Premenopausal women, in particular, should avoid reducing lean red meat because this could increase the risk for low red blood cells (iron-deficiency  anemia). Processed Meats Processed meats, such as bacon, bologna, salami, sausage, and hot dogs contain large quantities of fat, are not rich in valuable nutrients, and should not be eaten very often. Organ Meats The organ meats, such as liver, sweetbreads, kidneys, and brain are very rich in cholesterol. They should be limited. Chicken and [place] These are good sources of protein. The fat of poultry can be reduced by removing the skin and underlying fat layers before cooking. Chicken and [place] can be substituted for lean red meat in the diet. Poultry should not be fried or covered with high-fat sauces. Fish and Shellfish Fish is a good source of protein. Shellfish contain cholesterol, but they usually are low in saturated fatty acids. The preparation of fish is important. Like chicken and [place], they should not be fried or covered with high-fat sauces. EGGS Egg yolks often are hidden in cooked and processed foods. Egg whites contain no fat or cholesterol. They can be eaten often. Try 1 to 2 egg whites instead of whole eggs in recipes or use egg substitutes that do not contain yolk. MILK AND DAIRY PRODUCTS Use skim or 1% milk instead of 2% or whole milk. Decrease whole milk, natural, and processed cheeses. Use nonfat or low-fat (2%) cottage cheese or low-fat cheeses made from vegetable oils. Choose nonfat or low-fat (1 to 2%) yogurt. Experiment with evaporated skim milk in recipes that call for heavy cream. Substitute low-fat yogurt or low-fat cottage cheese for sour cream in dips and salad dressings. Have at least 2 servings of low-fat dairy products, such  as 2 glasses of skim (or 1%) milk each day to help get your daily calcium intake. FATS AND OILS Reduce the total intake of fats, especially saturated fat. Butterfat, lard, and beef fats are high in saturated fat and cholesterol. These should be avoided as much as possible. Vegetable fats do not contain cholesterol, but certain vegetable fats, such  as coconut oil, palm oil, and palm kernel oil are very high in saturated fats. These should be limited. These fats are often used in bakery goods, processed foods, popcorn, oils, and nondairy creamers. Vegetable shortenings and some peanut butters contain hydrogenated oils, which are also saturated fats. Read the labels on these foods and check for saturated vegetable oils. Unsaturated vegetable oils and fats do not raise blood cholesterol. However, they should be limited because they are fats and are high in calories. Total fat should still be limited to 30% of your daily caloric intake. Desirable liquid vegetable oils are corn oil, cottonseed oil, olive oil, canola oil, safflower oil, soybean oil, and sunflower oil. Peanut oil is not as good, but small amounts are acceptable. Buy a heart-healthy tub margarine that has no partially hydrogenated oils in the ingredients. Mayonnaise and salad dressings often are made from unsaturated fats, but they should also be limited because of their high calorie and fat content. Seeds, nuts, peanut butter, olives, and avocados are high in fat, but the fat is mainly the unsaturated type. These foods should be limited mainly to avoid excess calories and fat. OTHER EATING TIPS Snacks  Most sweets should be limited as snacks. They tend to be rich in calories and fats, and their caloric content outweighs their nutritional value. Some good choices in snacks are graham crackers, melba toast, soda crackers, bagels (no egg), English muffins, fruits, and vegetables. These snacks are preferable to snack crackers, [place] fries, and chips. Popcorn should be air-popped or cooked in small amounts of liquid vegetable oil. Desserts Eat fruit, low-fat yogurt, and fruit ices instead of pastries, cake, and cookies. Sherbet, angel food cake, gelatin dessert, frozen low-fat yogurt, or other frozen products that do not contain saturated fat (pure fruit juice bars, frozen ice pops) are also  acceptable.  COOKING METHODS Choose those methods that use little or no fat. They include:  Poaching.   Braising.   Steaming.   Grilling.   Baking.   Stir-frying.   Broiling.   Microwaving.  Foods can be cooked in a nonstick pan without added fat, or use a nonfat cooking spray in regular cookware. Limit fried foods and avoid frying in saturated fat. Add moisture to lean meats by using water, broth, cooking wines, and other nonfat or low-fat sauces along with the cooking methods mentioned above. Soups and stews should be chilled after cooking. The fat that forms on top after a few hours in the refrigerator should be skimmed off. When preparing meals, avoid using excess salt. Salt can contribute to raising blood pressure in some people. EATING AWAY FROM HOME Order entres, potatoes, and vegetables without sauces or butter. When meat exceeds the size of a deck of cards (3 to 4 ounces), the rest can be taken home for another meal. Choose vegetable or fruit salads and ask for low-calorie salad dressings to be served on the side. Use dressings sparingly. Limit high-fat toppings, such as bacon, crumbled eggs, cheese, sunflower seeds, and olives. Ask for heart-healthy tub margarine instead of butter. Document Released: 06/11/2001 Document Re-Released: 03/16/2009 [facility] Patient Information 2011 [place], [place].   Diet  for GERD or PUD Nutrition therapy can help ease the discomfort of gastroesophageal reflux disease (GERD) and peptic ulcer disease (PUD).  HOME CARE INSTRUCTIONS  Eat your meals slowly, in a relaxed setting.   Eat 5 to 6 small meals per day.   If a food causes distress, stop eating it for a period of time.  FOODS TO AVOID:  Coffee, regular or decaffeinated.  Cola beverages, regular or low calorie.   Tea, regular or decaffeinated.   Pepper.   Cocoa.   High fat foods including meats.   Butter, margarine, hydrogenated oil (trans fats).  Peppermint or spearmint  (if you have GERD).   Fruits and vegetables as tolerated.   Alcoholic beverages.   Nicotine (smoking or chewing). This is one of the most potent stimulants to acid production in the gastrointestinal tract.   Any food that seems to aggravate your condition.   If you have questions regarding your diet, call your caregiver's office or a registered dietitian. OTHER TIPS IF YOU HAVE GERD:  Lying flat may make symptoms worse. Keep the head of your bed raised 6 to 9 inches by using a foam wedge or blocks under the legs of the bed.   Do not lay down until 3 hours after eating a meal.   Daily physical activity may help reduce symptoms.  MAKE SURE YOU:   Understand these instructions.   Will watch your condition.   Will get help right away if you are not doing well or get worse.  Document Released: 12/20/2004 Document Re-Released: 05/08/2008 [facility] Patient Information 2011 [place], [place].

## 2010-09-22 NOTE — Progress Notes (Signed)
Primary Care Physician: No primary provider on file.  Primary Gastroenterologist:  Roetta Sessions, MD   Chief Complaint  Patient presents with  . Follow-up    throwing up on sunday with some blood in it    HPI: Jonathon Guerrero is a 40 y.o. male here for further evaluation of hematemesis. H/O esophagitis, H Pylori gastritis. H/O rectal bleeding felt to be due to hemorrhoids. See PSH for details of recent EGD/TCS. This weekend recurrent vomiting with some blood in it. Vomiting after eating eggs/sausage/gravy toast. Vomiting also after fried chicken. C/O heartburn. All worse with meals. Eating a lot of Rolaids. Currently pantoprazole on hold while taking Prevpac. 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 Needs to be released for full duties before he can return. 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   Also add hematemesis last week. We advised him to go to ED but his son had seizure so he could not go. Still with some BRBPR, worse in morning. BM 3-4 times per day. Blood with most stools. Stools real loose. Pp loose stool.   Current Outpatient Prescriptions  Medication Sig Dispense Refill  . amoxicillin-clarithromycin-lansoprazole (PREVPAC) combo pack Take by mouth 2 (two) times daily. Follow package directions.  1 kit  0  . pantoprazole (PROTONIX) 40 MG tablet Take 1 tablet (40 mg total) by mouth 2 (two) times daily.  60 tablet  11    Allergies as of 09/22/2010 - Review Complete 09/22/2010  Allergen Reaction Noted  . Sardine flavor  09/01/2010  . Poison sumac extract Itching, Swelling, and Rash 09/01/2010   Past Surgical History  Procedure Date  . Polypectomy 09/09/2010    Procedure: POLYPECTOMY;  Surgeon: Corbin Ade, MD;  Location: [facility];  Service: Endoscopy;;  . Esophagogastroduodenoscopy 09/2010    severe erosive RE, Barrett's, h.pylori gastritis  . Colonoscopy 09/2010    hemorrhoids,  small hepatic flexure polyp (ablated)    ROS:  General: Negative for anorexia, weight loss, fever, chills, fatigue, weakness. ENT: Negative for hoarseness, difficulty swallowing , nasal congestion. CV: Negative for chest pain, angina, palpitations, dyspnea on exertion, peripheral edema.  Respiratory: Negative for dyspnea at rest, dyspnea on exertion, cough, sputum, wheezing.  GI: See history of present illness. GU:  Negative for dysuria, hematuria, urinary incontinence, urinary frequency, nocturnal urination.  Endo: Negative for unusual weight change.    Physical Examination:   BP 156/95  Pulse 91  Temp(Src) 97.9 F (36.6 C) (Temporal)  Ht 5\' 7"  (1.702 m)  Wt 228 lb 3.2 oz (103.511 kg)  BMI 35.74 kg/m2  General: Well-nourished, well-developed in no acute distress.  Eyes: No icterus. Mouth: Oropharyngeal mucosa moist and pink , no lesions erythema or exudate. Lungs: Clear to auscultation bilaterally.  Heart: Regular rate and rhythm, no murmurs rubs or gallops.  Abdomen: Bowel sounds are normal, mild epigastric tenderness, nondistended, no hepatosplenomegaly or masses, no abdominal bruits or hernia , no rebound or guarding.   Extremities: No lower extremity edema. No clubbing or deformities. Neuro: Alert and oriented x 4   Skin: Warm and dry, no jaundice.   Psych: Alert and cooperative, normal mood and affect.   Imaging Studies: Ct Abdomen Pelvis W Contrast  09/01/2010  *RADIOLOGY REPORT*  Clinical Data: 40 year old male with right lower quadrant pain, diarrhea, bloody stools.  CT ABDOMEN AND PELVIS WITH CONTRAST  Technique:  Multidetector CT  imaging of the abdomen and pelvis was performed following the standard protocol during bolus administration of intravenous contrast.  Contrast: 100 ml Omnipaque-300.  Comparison: None.  Findings: Clear lung bases. No osseous abnormality identified.  No pelvic free fluid.  The rectum is unremarkable.  There is mild sigmoid wall thickening but no  sigmoid mesenteric stranding (series 2 image 67).  Decompressed bladder.  No pelvic or inguinal lymphadenopathy.  Negative descending colon.  Negative transverse and right colon. No dilated small bowel.  There are are several prominent loops in the left upper quadrant which may be mildly thickened, but no adjacent mesenteric stranding.  A small fat containing hiatal hernia, otherwise negative stomach.  Duodenum also may be mildly thickened but without adjacent stranding. Negative liver, gallbladder, spleen, pancreas, adrenal glands, kidneys, and portal venous system.  Negative major arterial structures except for intermittent calcified atherosclerosis.  No abdominal free fluid or lymphadenopathy.  IMPRESSION: Negative CT of the abdomen pelvis except for questionable intermittent wall thickening in the sigmoid colon and proximal small bowel.  No associated mesenteric fluid or inflammation, but mild enterocolitis is not excluded.  Original Report Authenticated By: Harley Hallmark, M.D.

## 2010-09-23 ENCOUNTER — Encounter: Payer: Self-pay | Admitting: Gastroenterology

## 2010-09-23 LAB — CBC WITH DIFFERENTIAL/PLATELET
Lymphocytes Relative: 38 % (ref 12–46)
Lymphs Abs: 2.1 10*3/uL (ref 0.7–4.0)
Neutrophils Relative %: 53 % (ref 43–77)
Platelets: 207 10*3/uL (ref 150–400)
RBC: 4.42 MIL/uL (ref 4.22–5.81)
WBC: 5.6 10*3/uL (ref 4.0–10.5)

## 2010-09-23 NOTE — Assessment & Plan Note (Signed)
Surveillance EGD 09/2011.

## 2010-09-23 NOTE — Assessment & Plan Note (Signed)
Likely due to hemorrhoids. Recent TCS reassuring. CBC. Suppositories. Next TCS due 09/2020.

## 2010-09-23 NOTE — Assessment & Plan Note (Signed)
Hematemesis in setting of known erosive RE, H.Pylori gastritis, large hh. He is consuming high fat diet which is exacerbating symptoms. Complete Prevpac and then resume pantoprazole BID. Bland, low-fat diet. Antireflux measures. Check CBC. Add another course of Carafate. Patient will call next week with PR. Out-of-work until 09/29/10.

## 2010-09-24 NOTE — Progress Notes (Signed)
No PCP on file 

## 2010-09-24 NOTE — Progress Notes (Signed)
Quick Note:  H/H normal. PR next week as planned. Plan as outlined in OV note. ______

## 2010-09-30 ENCOUNTER — Telehealth: Payer: Self-pay

## 2010-09-30 NOTE — Telephone Encounter (Signed)
Pt called with progress report. Stated he was feeling better and only noticed a little bit of blood and a little bit of pain. Pt requesting appt. Ginger made him appt for Tuesday 10/05/10 at 2pm.

## 2010-09-30 NOTE — Telephone Encounter (Signed)
Noted  

## 2010-10-05 ENCOUNTER — Ambulatory Visit: Payer: BC Managed Care – PPO | Admitting: Gastroenterology

## 2010-10-07 ENCOUNTER — Encounter: Payer: Self-pay | Admitting: Urgent Care

## 2010-10-07 ENCOUNTER — Ambulatory Visit (INDEPENDENT_AMBULATORY_CARE_PROVIDER_SITE_OTHER): Payer: BC Managed Care – PPO | Admitting: Urgent Care

## 2010-10-07 DIAGNOSIS — K2270 Barrett's esophagus without dysplasia: Secondary | ICD-10-CM

## 2010-10-07 DIAGNOSIS — K219 Gastro-esophageal reflux disease without esophagitis: Secondary | ICD-10-CM

## 2010-10-07 NOTE — Assessment & Plan Note (Addendum)
Erosive reflux esophagitis.  GERD symptoms well controlled on BID Protonix. Office visit 6 months Call if any problems You may return to work Colonoscopy 09/2020 or sooner if any problems

## 2010-10-07 NOTE — Assessment & Plan Note (Signed)
Newly dx. EGD for surveillance 09/2011

## 2010-10-07 NOTE — Patient Instructions (Signed)
Office visit 6 months Call if any problems You may return to work Continue protonix 40mg  twice per day   Diet for GERD or PUD Nutrition therapy can help ease the discomfort of gastroesophageal reflux disease (GERD) and peptic ulcer disease (PUD).  HOME CARE INSTRUCTIONS  Eat your meals slowly, in a relaxed setting.   Eat 5 to 6 small meals per day.   If a food causes distress, stop eating it for a period of time.  FOODS TO AVOID:  Coffee, regular or decaffeinated.  Cola beverages, regular or low calorie.   Tea, regular or decaffeinated.   Pepper.   Cocoa.   High fat foods including meats.   Butter, margarine, hydrogenated oil (trans fats).  Peppermint or spearmint (if you have GERD).   Fruits and vegetables as tolerated.   Alcoholic beverages.   Nicotine (smoking or chewing). This is one of the most potent stimulants to acid production in the gastrointestinal tract.   Any food that seems to aggravate your condition.   If you have questions regarding your diet, call your caregiver's office or a registered dietitian. OTHER TIPS IF YOU HAVE GERD:  Lying flat may make symptoms worse. Keep the head of your bed raised 6 to 9 inches by using a foam wedge or blocks under the legs of the bed.   Do not lay down until 3 hours after eating a meal.   Daily physical activity may help reduce symptoms.  MAKE SURE YOU:   Understand these instructions.   Will watch your condition.   Will get help right away if you are not doing well or get worse.  Document Released: 12/20/2004 Document Re-Released: 05/08/2008 [facility] Patient Information 2011 [place], [place].

## 2010-10-07 NOTE — Progress Notes (Signed)
No PCP Primary Gastroenterologist:  Dr. Jena Gauss  Chief Complaint  Patient presents with  . follow up    h pylori gastritis    HPI:  Jonathon Guerrero is a 40 y.o. male here for follow up for hematemesis w/ hx h pylori gastritis, GERD(erosive esophagitis), Barrett's.  Doing well.  Completed prevpac.   Denies any upper GI symptoms including heartburn, indigestion, nausea, vomiting, dysphagia, odynophagia or anorexia.   Denies any lower GI symptoms including constipation, diarrhea, rectal bleeding, melena or weight loss. On protonix 40mg  BID.  Completed hemorrhoid supp.  Feels 100% better.  Ready to go back to work. Past Medical History  Diagnosis Date  . GERD (gastroesophageal reflux disease)   . Barrett's esophagus without dysplasia   . Reflux esophagitis     severe erosive RE on EGD 09/2010  . Helicobacter pylori gastritis 09/2010    completed Prevpac    Past Surgical History  Procedure Date  . Polypectomy 09/09/2010    Dr Jena Gauss  . Esophagogastroduodenoscopy 09/2010    severe erosive RE, Barrett's, h.pylori gastritis  . Colonoscopy 09/2010    hemorrhoids, small hepatic flexure polyp (ablated)    Current Outpatient Prescriptions  Medication Sig Dispense Refill  . pantoprazole (PROTONIX) 40 MG tablet Take 1 tablet (40 mg total) by mouth 2 (two) times daily.  60 tablet  11    Allergies as of 10/07/2010 - Review Complete 10/07/2010  Allergen Reaction Noted  . Sardine flavor  09/01/2010  . Poison sumac extract Itching, Swelling, and Rash 09/01/2010    Review of Systems: See HPI, otherwise negative ROS  Physical Exam: BP 138/90  Pulse 76  Temp(Src) 97.7 F (36.5 C) (Temporal)  Ht 5\' 7"  (1.702 m)  Wt 229 lb 9.6 oz (104.146 kg)  BMI 35.96 kg/m2 General:   Alert,  Well-developed, well-nourished, pleasant and cooperative in NAD Eyes:  Sclera clear, no icterus.   Conjunctiva pink. Mouth:  No deformity or lesions, dentition normal. Heart:  Regular rate and rhythm; no murmurs,  clicks, rubs,  or gallops. Abdomen:  Soft, nontender and nondistended. No masses, hepatosplenomegaly or hernias noted. Normal bowel sounds, without guarding, and without rebound.   Extremities:  Without clubbing or edema. Neurologic:  Alert and  oriented x4;  grossly normal neurologically. Skin:  Intact without significant lesions or rashes. Psych:  Alert and cooperative. Normal mood and affect.

## 2010-10-08 NOTE — Progress Notes (Signed)
No PCP on file 

## 2010-11-05 ENCOUNTER — Telehealth: Payer: Self-pay | Admitting: Urgent Care

## 2010-11-05 NOTE — Telephone Encounter (Signed)
Pt aware, papers at front desk for him to pick up

## 2010-11-05 NOTE — Telephone Encounter (Signed)
Have tried to call pt x2, NA each time.

## 2010-12-02 NOTE — Progress Notes (Signed)
REVIEWED.  

## 2010-12-14 ENCOUNTER — Telehealth: Payer: Self-pay

## 2010-12-14 NOTE — Telephone Encounter (Signed)
Rep from pts disablilty company called and left voicemail. There is a problem with pts forms. Pt claims to have been out of work from 09/03/10 till 11/04/10. The paperwork we sent states 09/07/10 to 10/07/10. Tried to call Bo back at [phone] and was unable to speak with anyone at that time. Will continue to try to call back.

## 2011-04-04 ENCOUNTER — Encounter: Payer: Self-pay | Admitting: Internal Medicine

## 2016-09-28 DIAGNOSIS — Z139 Encounter for screening, unspecified: Secondary | ICD-10-CM

## 2016-09-28 LAB — GLUCOSE, POCT (MANUAL RESULT ENTRY): POC Glucose: 102 mg/dl — AB (ref 70–99)

## 2016-09-28 NOTE — Congregational Nurse Program (Signed)
Congregational Nurse Program Note  Date of Encounter: 09/28/2016  Past Medical History: Past Medical History:  Diagnosis Date  . Barrett's esophagus without dysplasia   . GERD (gastroesophageal reflux disease)   . Helicobacter pylori gastritis 09/2010   completed Prevpac  . Reflux esophagitis    severe erosive RE on EGD 09/2010    Encounter Details:     CNP Questionnaire - 09/28/16 1505      Patient Demographics   Is this a new or existing patient? New   Patient is considered a/an Not Applicable   Race African-American/Black     Patient Assistance   Location of Patient [place]   Patient's financial/insurance status Low Income;Self-Pay (Uninsured)   Uninsured Patient ([place]) Yes   Interventions Counseled to make appt. with provider;Assisted patient in making appt.   Patient referred to apply for the following financial assistance Not Applicable   Food insecurities addressed Not Applicable   Transportation assistance No   Assistance securing medications No   Educational health offerings Acute disease;Chronic disease;Hypertension;Navigating the healthcare system;Safety     Encounter Details   Primary purpose of visit Chronic Illness/Condition Visit;Acute Illness/Condition Visit;Education/Health Concerns;Safety;Navigating the Healthcare System   Was an Emergency Department visit averted? Yes   Does patient have a medical provider? No   Patient referred to Area Agency;Clinic;Establish PCP   Was a mental health screening completed? (GAINS tool) No   Does patient have dental issues? No   Does patient have vision issues? No   Does your patient have an abnormal blood pressure today? Yes   Since previous encounter, have you referred patient for abnormal blood pressure that resulted in a new diagnosis or medication change? No   Does your patient have an abnormal blood glucose today? No   Since previous encounter, have you referred patient for  abnormal blood glucose that resulted in a new diagnosis or medication change? No   Was there a life-saving intervention made? No      New client to [facility] today seeking help navigating into a primary care provider. Client is currently living with his girlfriend who helps him with food and support currently. 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  Past Medical history: GERD 2012 Barrett's esophagus without dysplasia 2012 Reflux esophagitis Colonoscopy with polyp removal 2012 H- Pylori 2012 Epilepsy Alcohol abuse Cannabis use Rectal bleeding 2012 Hemorrhoids Hypertension  Medications None currently  Alert and oriented seems anxious, answers questions appropriately. Client reports recurrence of rectal bleeding off and on when he wipes and notices some in the toilet. He reports no bleeding today. He reports occasional constipation. Last noted blood some bright red and at times darker was yesterday after having a bowel movement. He states his bowel movement "kind of oozy" Client is also complaining of pain at times in his right upper quadrant of his abdomen. He describes it as getting tight then letting up. He denies pain at present. Bowel sounds present x 4 and abdomen soft. Denies chest pain or shortness of breath . Lungs clear bilaterally. Denies any vomiting blood. Does not know if he is having acid reflux, but does report having a sour taste in his mouth a lot especially in the mornings. Client reports feeling anxious coming here today. He is a poor historian and has difficulty remembering what medical providers have discussed with him.  He is also concerned about his seizures. He reports his has not been on medications for a long  time and he was last seen by a neurologist he does not remember in [place]. He reports not taking any medications including medications for his hypertension and is unable to remember any names of those previous medications.  He  denies any weight loss and any swelling of his arms, legs or abdomen. He states his appetite is good, he reports being stressed about not getting his disability and no job. He is reapplying for disability. Client reports that he last had a seizure about a month ago and that whoever is with him "brings me out of it" when asked what type of seizures he has been diagnosed with he does not know nor medication he took for them. Client did drive here today and RN counseled him on driving safely if he is having seizures and no medications. Explained to client he could not only harm himself but others as well. Recommended to client to have someone drive him to his appointments if possible. Client states he will attempt to do so and states understanding of safety issue. Client reports drinking 3 to 4 shots of liquor a day and 3 to 4 beers a week and about a pint of liquor a week. He also reports smoking about a pack of cigarettes a day. He denies use of tylenol , aspirin. He does report a recent use of ibuprofen.  He denies headaches or blurry vision, denies weakness of extremities including facial weakness. Brisk capillary refill. Discussed with client about decreasing alcohol use, decreasing and quitting smoking and reasons it affects his health issues. RN also counseled client on tips for acid reflux, such as elevating head of bed, limiting spicy foods and not laying flat until 3 hours after eating a meal. Client states understanding.  Discussed with client options for medial care and appointment made at the [facility] for 09/29/16 at 2:15 pm. Contact information given.  Blood pressure 157/99 pulse 85 right arm Left arm 160/98 pulse 90 left arm. Temp 99.2 orally Glucose random 102  Will follow as needed.

## 2016-09-29 ENCOUNTER — Ambulatory Visit: Payer: Self-pay | Admitting: Physician Assistant

## 2016-09-29 ENCOUNTER — Encounter: Payer: Self-pay | Admitting: Physician Assistant

## 2016-09-29 VITALS — BP 150/76 | HR 83 | Temp 97.7°F | Ht 67.0 in | Wt 203.2 lb

## 2016-09-29 DIAGNOSIS — K2270 Barrett's esophagus without dysplasia: Secondary | ICD-10-CM

## 2016-09-29 DIAGNOSIS — Z86010 Personal history of colonic polyps: Secondary | ICD-10-CM

## 2016-09-29 DIAGNOSIS — R7989 Other specified abnormal findings of blood chemistry: Secondary | ICD-10-CM

## 2016-09-29 DIAGNOSIS — R945 Abnormal results of liver function studies: Secondary | ICD-10-CM

## 2016-09-29 DIAGNOSIS — R160 Hepatomegaly, not elsewhere classified: Secondary | ICD-10-CM

## 2016-09-29 DIAGNOSIS — I10 Essential (primary) hypertension: Secondary | ICD-10-CM

## 2016-09-29 DIAGNOSIS — F419 Anxiety disorder, unspecified: Secondary | ICD-10-CM

## 2016-09-29 DIAGNOSIS — G40909 Epilepsy, unspecified, not intractable, without status epilepticus: Secondary | ICD-10-CM

## 2016-09-29 MED ORDER — LISINOPRIL 10 MG PO TABS: 10.0000 mg | ORAL_TABLET | Freq: Every day | ORAL | 1 refills | Status: DC

## 2016-09-29 NOTE — Patient Instructions (Signed)
 -  Refer to GI for repeat conolonoscopy/barretts esophagus -Refer to neuro for mgt seizure d/o -[facility] discount application -[facility] for anxiety -get Baseline labs -Lisinopril for bp

## 2016-09-29 NOTE — Progress Notes (Signed)
BP (!) 150/76 (BP Location: Left Arm, Patient Position: Sitting, Cuff Size: Normal)   Pulse 83   Temp 97.7 F (36.5 C)   Ht 5\' 7"  (1.702 m)   Wt 203 lb 4 oz (92.2 kg)   SpO2 99%   BMI 31.83 kg/m    Subjective:    Patient ID: Jonathon Guerrero, male    DOB: 24-May-1970, 46 y.o.   MRN: [id number]  HPI: Jonathon Guerrero is a 46 y.o. male presenting on 09/29/2016 for New Patient (Initial Visit) (pt states he need HTN and seizure medicines)   HPI   Pt has not had PCP in 4 years.   He says it has been about that long since he went to the neurologist.  He had insurance then and has been without for some years.   Pt says he has a seizure about every 1 1/2 month.  He says he does not drive.   Relevant past medical, surgical, family and social history reviewed and updated as indicated. Interim medical history since our last visit reviewed. Allergies and medications reviewed and updated.  No current outpatient prescriptions on file.   Review of Systems  Constitutional: Negative for appetite change, chills, diaphoresis, fatigue, fever and unexpected weight change.  HENT: Positive for dental problem. Negative for congestion, drooling, ear pain, facial swelling, hearing loss, mouth sores, sneezing, sore throat, trouble swallowing and voice change.   Eyes: Negative for pain, discharge, redness, itching and visual disturbance.  Respiratory: Negative for cough, choking, shortness of breath and wheezing.   Cardiovascular: Negative for chest pain, palpitations and leg swelling.  Gastrointestinal: Positive for blood in stool. Negative for abdominal pain, constipation, diarrhea and vomiting.  Endocrine: Negative for cold intolerance, heat intolerance and polydipsia.  Genitourinary: Negative for decreased urine volume, dysuria and hematuria.  Musculoskeletal: Negative for arthralgias, back pain and gait problem.  Skin: Negative for rash.  Allergic/Immunologic: Negative for environmental allergies.   Neurological: Positive for seizures. Negative for syncope, light-headedness and headaches.  Hematological: Negative for adenopathy.  Psychiatric/Behavioral: Negative for agitation, dysphoric mood and suicidal ideas. The patient is not nervous/anxious.     Per HPI unless specifically indicated above     Objective:    BP (!) 150/76 (BP Location: Left Arm, Patient Position: Sitting, Cuff Size: Normal)   Pulse 83   Temp 97.7 F (36.5 C)   Ht 5\' 7"  (1.702 m)   Wt 203 lb 4 oz (92.2 kg)   SpO2 99%   BMI 31.83 kg/m   Wt Readings from Last 3 Encounters:  09/29/16 203 lb 4 oz (92.2 kg)  09/28/16 199 lb 12.8 oz (90.6 kg)  10/07/10 229 lb 9.6 oz (104.1 kg)    Physical Exam  Constitutional: He is oriented to person, place, and time. He appears well-developed and well-nourished.  HENT:  Head: Normocephalic and atraumatic.  Mouth/Throat: Oropharynx is clear and moist. No oropharyngeal exudate.  Eyes: Pupils are equal, round, and reactive to light. Conjunctivae and EOM are normal.  Neck: Neck supple. No thyromegaly present.  Cardiovascular: Normal rate and regular rhythm.   Pulmonary/Chest: Effort normal and breath sounds normal. He has no wheezes. He has no rales.  Abdominal: Soft. Bowel sounds are normal. He exhibits no mass. There is no hepatosplenomegaly. There is no tenderness.  Musculoskeletal: He exhibits no edema.  Lymphadenopathy:    He has no cervical adenopathy.  Neurological: He is alert and oriented to person, place, and time.  Skin: Skin is warm and dry.  No rash noted.  Psychiatric: He has a normal mood and affect. His behavior is normal. Thought content normal.  Vitals reviewed.       Assessment & Plan:   Encounter Diagnoses  Name Primary?  . Essential hypertension Yes  . Seizure disorder ([place])   . Barrett's esophagus without dysplasia   . History of colon polyps   . Anxiety   . Liver mass   . Elevated LFTs     -record request sent for [facility] from [facility] on liver mass- 2013 ? [place] vs [place] -will refer to GI for repeat conolonoscopy (dr Gala Romney)- history polyps (per pt, unable to find report) and Barrett's esophagus -Refer to neurology for management of seizure disorder -pt was given [facility] discount application -encouraged pt to go to [facility] for anxiety -will get Baseline labs -rx Lisinopril for bp -pt to follow up one month. RTO sooner prn

## 2016-10-27 ENCOUNTER — Ambulatory Visit: Payer: Self-pay | Admitting: Physician Assistant

## 2016-10-31 ENCOUNTER — Encounter: Payer: Self-pay | Admitting: Physician Assistant

## 2016-11-20 ENCOUNTER — Emergency Department (HOSPITAL_COMMUNITY)
Admission: EM | Admit: 2016-11-20 | Discharge: 2016-11-21 | Disposition: A | Discharge status: Home / Self Care | Payer: No Typology Code available for payment source | Attending: Emergency Medicine | Admitting: Emergency Medicine

## 2016-11-20 ENCOUNTER — Encounter (HOSPITAL_COMMUNITY): Payer: Self-pay | Admitting: Emergency Medicine

## 2016-11-20 DIAGNOSIS — I10 Essential (primary) hypertension: Secondary | ICD-10-CM | POA: Diagnosis not present

## 2016-11-20 DIAGNOSIS — R1031 Right lower quadrant pain: Secondary | ICD-10-CM | POA: Diagnosis present

## 2016-11-20 DIAGNOSIS — F17210 Nicotine dependence, cigarettes, uncomplicated: Secondary | ICD-10-CM | POA: Insufficient documentation

## 2016-11-20 DIAGNOSIS — Z79899 Other long term (current) drug therapy: Secondary | ICD-10-CM | POA: Diagnosis not present

## 2016-11-20 DIAGNOSIS — R109 Unspecified abdominal pain: Secondary | ICD-10-CM

## 2016-11-20 NOTE — ED Triage Notes (Signed)
Pt states he started having pain a week or two ago and the pain has "come and gone" Pt points at his right side of his abdomen. Pt states his pain is a level 10 on a scale of 0 to 10. Pt states he has not taken any OTC medications for the pain. Pt states the pain feels like a knott and feels like tightening and it it slowly moves and squeezes in the general area.

## 2016-11-21 ENCOUNTER — Emergency Department (HOSPITAL_COMMUNITY): Payer: No Typology Code available for payment source

## 2016-11-21 LAB — CBC WITH DIFFERENTIAL/PLATELET
Basophils Absolute: 0 10*3/uL (ref 0.0–0.1)
Basophils Relative: 1 %
EOS PCT: 2 %
Eosinophils Absolute: 0.1 10*3/uL (ref 0.0–0.7)
HEMATOCRIT: 38.7 % — AB (ref 39.0–52.0)
Hemoglobin: 13.4 g/dL (ref 13.0–17.0)
LYMPHS ABS: 2.6 10*3/uL (ref 0.7–4.0)
LYMPHS PCT: 59 %
MCH: 34.4 pg — AB (ref 26.0–34.0)
MCHC: 34.6 g/dL (ref 30.0–36.0)
MCV: 99.5 fL (ref 78.0–100.0)
MONO ABS: 0.4 10*3/uL (ref 0.1–1.0)
Monocytes Relative: 9 %
NEUTROS ABS: 1.2 10*3/uL — AB (ref 1.7–7.7)
NEUTROS PCT: 29 %
Platelets: 121 10*3/uL — ABNORMAL LOW (ref 150–400)
RBC: 3.89 MIL/uL — ABNORMAL LOW (ref 4.22–5.81)
RDW: 14 % (ref 11.5–15.5)
WBC: 4.3 10*3/uL (ref 4.0–10.5)

## 2016-11-21 LAB — COMPREHENSIVE METABOLIC PANEL
ALT: 163 U/L — AB (ref 17–63)
AST: 247 U/L — AB (ref 15–41)
Albumin: 4.3 g/dL (ref 3.5–5.0)
Alkaline Phosphatase: 80 U/L (ref 38–126)
Anion gap: 9 (ref 5–15)
BILIRUBIN TOTAL: 1.1 mg/dL (ref 0.3–1.2)
BUN: 10 mg/dL (ref 6–20)
CO2: 32 mmol/L (ref 22–32)
Calcium: 8.6 mg/dL — ABNORMAL LOW (ref 8.9–10.3)
Chloride: 100 mmol/L — ABNORMAL LOW (ref 101–111)
Creatinine, Ser: 0.97 mg/dL (ref 0.61–1.24)
Glucose, Bld: 92 mg/dL (ref 65–99)
Potassium: 3.8 mmol/L (ref 3.5–5.1)
Sodium: 141 mmol/L (ref 135–145)
Total Protein: 7.3 g/dL (ref 6.5–8.1)

## 2016-11-21 LAB — LIPASE, BLOOD: Lipase: 52 U/L — ABNORMAL HIGH (ref 11–51)

## 2016-11-21 MED ORDER — MORPHINE SULFATE (PF) 4 MG/ML IV SOLN
4.0000 mg | Freq: Once | INTRAVENOUS | Status: AC
  Administered 2016-11-21: 4 mg via INTRAVENOUS
  Filled 2016-11-21: qty 1

## 2016-11-21 MED ORDER — HYDROCODONE-ACETAMINOPHEN 5-325 MG PO TABS
1.0000 | ORAL_TABLET | Freq: Once | ORAL | Status: AC
  Administered 2016-11-21: 1 via ORAL
  Filled 2016-11-21: qty 1

## 2016-11-21 MED ORDER — IOPAMIDOL (ISOVUE-300) INJECTION 61%
100.0000 mL | Freq: Once | INTRAVENOUS | Status: AC | PRN
  Administered 2016-11-21: 100 mL via INTRAVENOUS

## 2016-11-21 MED ORDER — HYDROCODONE-ACETAMINOPHEN 5-325 MG PO TABS: 1.0000 | ORAL_TABLET | Freq: Four times a day (QID) | ORAL | 0 refills | Status: DC | PRN

## 2016-11-21 MED ORDER — ONDANSETRON HCL 4 MG/2ML IJ SOLN
4.0000 mg | Freq: Once | INTRAMUSCULAR | Status: AC
  Administered 2016-11-21: 4 mg via INTRAVENOUS
  Filled 2016-11-21: qty 2

## 2016-11-21 NOTE — Discharge Instructions (Signed)
Your CT scan shows fatty liver.

## 2016-11-21 NOTE — ED Provider Notes (Signed)
[facility] EMERGENCY DEPARTMENT Provider Note   CSN: [id number] Arrival date & time: 11/20/16  2327     History   Chief Complaint Chief Complaint  Patient presents with  . Flank Pain    HPI Jonathon Guerrero is a 46 y.o. male.  The history is provided by the patient.  Abdominal Pain   This is a recurrent problem. The current episode started 3 to 5 hours ago. The problem occurs constantly. The problem has been gradually worsening. The pain is associated with an unknown factor. The pain is located in the RLQ. The pain is at a severity of 10/10. The pain is severe. Pertinent negatives include fever, diarrhea, hematochezia, melena, nausea, vomiting, constipation, dysuria and frequency. The symptoms are aggravated by palpation. Nothing relieves the symptoms.   46 year old male who presents with right-sided abdominal pain.  He has a history of hypertension and esophagitis with GERD.  States several month history of intermittent right-sided abdominal pain that comes and goes.  Tonight his pain suddenly became more severe.  States that he has been referred to GI specialist who he has an appointment with tomorrow, but was unable to wait until tomorrow due to worsening pain.  He denies any nausea, vomiting, diarrhea, weight loss, hematuria, dysuria or urinary frequency.  He has not had any fevers, chills, or night sweats.  Pain is not associated with food or eating.  Several months ago did notice bright red blood mixed in with some of his stool, but that is now resolved.  No melena. Past Medical History:  Diagnosis Date  . Barrett's esophagus without dysplasia   . GERD (gastroesophageal reflux disease)   . Helicobacter pylori gastritis 09/2010   completed Prevpac  . Hypertension   . Reflux esophagitis    severe erosive RE on EGD 09/2010  . Seizures [facility])     Patient Active Problem List   Diagnosis Date Noted  . Barrett's esophagus without dysplasia 09/22/2010  . Reflux esophagitis  09/22/2010  . GERD (gastroesophageal reflux disease) 09/07/2010    Past Surgical History:  Procedure Laterality Date  . COLONOSCOPY  09/2010   hemorrhoids, small hepatic flexure polyp (ablated)  . COLONOSCOPY WITH PROPOFOL N/A 09/09/2010   Performed by Daneil Dolin, MD at [facility]  . ESOPHAGOGASTRODUODENOSCOPY  09/2010   severe erosive RE, Barrett's, h.pylori gastritis  . ESOPHAGOGASTRODUODENOSCOPY (EGD) WITH PROPOFOL N/A 09/09/2010   Performed by Daneil Dolin, MD at [facility]  . POLYPECTOMY  09/09/2010   Performed by Daneil Dolin, MD at [facility]       Home Medications    Prior to Admission medications   Medication Sig Start Date End Date Taking? Authorizing Provider  HYDROcodone-acetaminophen (NORCO/VICODIN) 5-325 MG tablet Take 1-2 tablets every 6 (six) hours as needed by mouth. 11/21/16   Forde Dandy, MD  lisinopril (PRINIVIL,ZESTRIL) 10 MG tablet Take 1 tablet (10 mg total) by mouth daily. 09/29/16   Soyla Dryer, PA-C    Family History Family History  Problem Relation Age of Onset  . Lung cancer Father 55  . Ulcers Father   . Hypertension Father   . Aneurysm Mother 6       Brain Aneurysm  . Hypertension Brother   . Seizures Son   . Anesthesia problems Neg Hx   . Hypotension Neg Hx   . Malignant hyperthermia Neg Hx   . Pseudochol deficiency Neg Hx     Social History Social History   Tobacco Use  .  Smoking status: Current Every Day Smoker    Packs/day: 0.75    Years: 9.00    Pack years: 6.75    Types: Cigarettes  . Smokeless tobacco: Never Used  Substance Use Topics  . Alcohol use: Yes    Comment: a pint of liquor per week, 1 pint of liqour a week.  3-4 cans of beer a week  . Drug use: Yes    Types: Marijuana    Comment: about once per month per client     Allergies   Sardine flavor and Poison sumac extract   Review of Systems Review of Systems  Constitutional: Negative for fever.  Gastrointestinal: Positive for abdominal pain. Negative for  constipation, diarrhea, hematochezia, melena, nausea and vomiting.  Genitourinary: Negative for dysuria and frequency.  All other systems reviewed and are negative.    Physical Exam Updated Vital Signs BP 124/75   Pulse 84   Temp 98 F (36.7 C) (Oral)   Resp 17   Ht 5\' 7"  (1.702 m)   Wt 89.8 kg (198 lb)   SpO2 98%   BMI 31.01 kg/m   Physical Exam Physical Exam  Nursing note and vitals reviewed. Constitutional: Well developed, well nourished, non-toxic, and in no acute distress Head: Normocephalic and atraumatic.  Mouth/Throat: Oropharynx is clear and moist.  Neck: Normal range of motion. Neck supple.  Cardiovascular: Normal rate and regular rhythm.   Pulmonary/Chest: Effort normal and breath sounds normal.  Abdominal: Soft. There is right sided tenderness. No significant Murphy's sign. No CVA tenderness.  There is no rebound and no guarding.  Musculoskeletal: Normal range of motion.  Neurological: Alert, no facial droop, fluent speech, moves all extremities symmetrically Skin: Skin is warm and dry.  Psychiatric: Cooperative   ED Treatments / Results  Labs (all labs ordered are listed, but only abnormal results are displayed) Labs Reviewed  CBC WITH DIFFERENTIAL/PLATELET - Abnormal; Notable for the following components:      Result Value   RBC 3.89 (*)    HCT 38.7 (*)    MCH 34.4 (*)    Platelets 121 (*)    Neutro Abs 1.2 (*)    All other components within normal limits  COMPREHENSIVE METABOLIC PANEL - Abnormal; Notable for the following components:   Chloride 100 (*)    Calcium 8.6 (*)    AST 247 (*)    ALT 163 (*)    All other components within normal limits  LIPASE, BLOOD - Abnormal; Notable for the following components:   Lipase 52 (*)    All other components within normal limits  URINALYSIS, ROUTINE W REFLEX MICROSCOPIC  HEPATITIS PANEL, ACUTE    EKG  EKG Interpretation None       Radiology Ct Abdomen Pelvis W Contrast  Result Date:  11/21/2016 CLINICAL DATA:  Initial evaluation for acute right-sided abdominal pain. EXAM: CT ABDOMEN AND PELVIS WITH CONTRAST TECHNIQUE: Multidetector CT imaging of the abdomen and pelvis was performed using the standard protocol following bolus administration of intravenous contrast. CONTRAST:  181mL ISOVUE-300 IOPAMIDOL (ISOVUE-300) INJECTION 61% COMPARISON:  Prior CT from 02/17/2014. FINDINGS: Lower chest: Study mildly degraded by motion artifact. Visualized lung bases are clear. Hepatobiliary: Diffuse hypoattenuation of the liver consistent with steatosis. 15 mm wedge-shaped hypervascular lesion within the subcapsular right hepatic lobe felt to be most consistent with AV shunting, similar to previous. Liver otherwise unremarkable. Gallbladder within normal limits. No biliary dilatation. Pancreas: Pancreas grossly unremarkable, although evaluation limited by motion. Spleen: Spleen within normal limits.  Adrenals/Urinary Tract: Adrenal glands are normal. Kidneys equal size with symmetric enhancement. No nephrolithiasis, hydronephrosis, or focal enhancing renal mass. No hydroureter. Bladder within normal limits. Stomach/Bowel: Moderate hiatal hernia. Stomach otherwise unremarkable. No evidence for bowel obstruction. Appendix within normal limits. No acute inflammatory changes seen about the bowels. Vascular/Lymphatic: Moderate aorto bi-iliac atherosclerotic disease. No aneurysm. Normal intravascular enhancement seen throughout the intra-abdominal aorta and its branch vessels. No adenopathy. Reproductive: Prostate normal. Other: No free air or fluid. Musculoskeletal: No acute osseus abnormality. No worrisome lytic or blastic osseous lesions. IMPRESSION: 1. No CT evidence for acute intra-abdominal or pelvic process. 2. Normal appendix. 3. Hepatic steatosis. 4. Moderate hiatal hernia. 5. Moderate aorto bi-iliac atherosclerotic disease. Electronically Signed   By: Jeannine Boga M.D.   On: 11/21/2016 01:14     Procedures Procedures (including critical care time)  Medications Ordered in ED Medications  HYDROcodone-acetaminophen (NORCO/VICODIN) 5-325 MG per tablet 1 tablet (not administered)  morphine 4 MG/ML injection 4 mg (4 mg Intravenous Given 11/21/16 0024)  ondansetron (ZOFRAN) injection 4 mg (4 mg Intravenous Given 11/21/16 0024)  iopamidol (ISOVUE-300) 61 % injection 100 mL (100 mLs Intravenous Contrast Given 11/21/16 0049)     Initial Impression / Assessment and Plan / ED Course  I have reviewed the triage vital signs and the nursing notes.  Pertinent labs & imaging results that were available during my care of the patient were reviewed by me and considered in my medical decision making (see chart for details).     Presenting with intermittent right-sided abdominal pain over the past several months.  He is nontoxic in no acute distress with normal vital signs.  Abdomen is soft and non-peritoneal.  Primarily with some right-sided tenderness to palpation without Murphy sign.  Blood work is notable for transaminitis AST of 240s and ALT of 160s.  He does endorse drinking daily alcohol and this may be a component of that.  There is no evidence of elevated alkaline phosphatase or hyperbilirubinemia.  A CT was performed to evaluate for hepatobiliary etiology such as choledocholithiasis, cholecystitis as well as other potential causes of his pain such as mass or malignancy.  This is visualized and shows no acute intra-abdominal processes.  No signs of biliary obstruction, gallbladder disease, or other acute processes. He does have fatty liver which could also explain his mild transaminitis.  His significant other also states that his pain seems consistent with drinking alcohol, and I question if maybe this could also be a component of alcoholic gastritis.  He is given pain control, to good effect.  He does have appointment with GI in [facility] tomorrow, which I feel that he is stable for further  workup as an outpatient. Hepatitis panel sent and pending. Strict return and follow-up instructions reviewed. He expressed understanding of all discharge instructions and felt comfortable with the plan of care.   Final Clinical Impressions(s) / ED Diagnoses   Final diagnoses:  Right sided abdominal pain    ED Discharge Orders        Ordered    HYDROcodone-acetaminophen (NORCO/VICODIN) 5-325 MG tablet  Every 6 hours PRN     11/21/16 0205       Forde Dandy, MD 11/21/16 [phone]

## 2016-11-22 LAB — HEPATITIS PANEL, ACUTE
HCV Ab: <0.1 s/co ratio (ref 0.0–0.9)
HEP A IGM: NEGATIVE
HEP B C IGM: NEGATIVE
HEP B S AG: NEGATIVE

## 2017-02-21 ENCOUNTER — Encounter: Payer: Self-pay | Admitting: Family Medicine

## 2018-02-27 IMAGING — CT CT ABD-PELV W/ CM
2 of 5 series · 16 of 46 positions shown, 18 images · IV contrast (Isovue)
Comparison: Prior CT from 02/17/2014.

CLINICAL DATA: Initial evaluation for acute right-sided abdominal
pain.

EXAM:
CT ABDOMEN AND PELVIS WITH CONTRAST
TECHNIQUE: Multidetector CT imaging of the abdomen and pelvis was performed
using the standard protocol following bolus administration of
intravenous contrast.
CONTRAST:  100mL [id number] IOPAMIDOL (1NG7AL-O77) INJECTION 61%

[Series 2: axial st · axial · 0.74mm/px · z∈[-647,-247]mm · 13 of 92 slices shown, 15 images]
[im 6/92  soft-tissue]
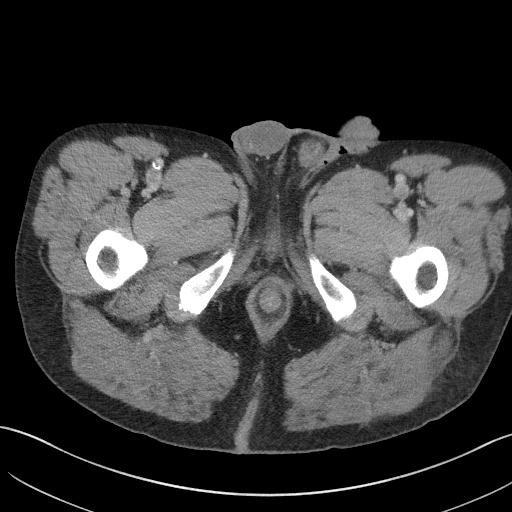
[im 6/92  bone]
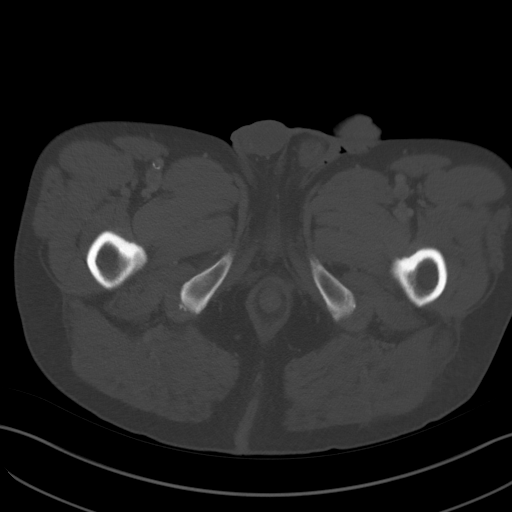
[im 11/92  soft-tissue]
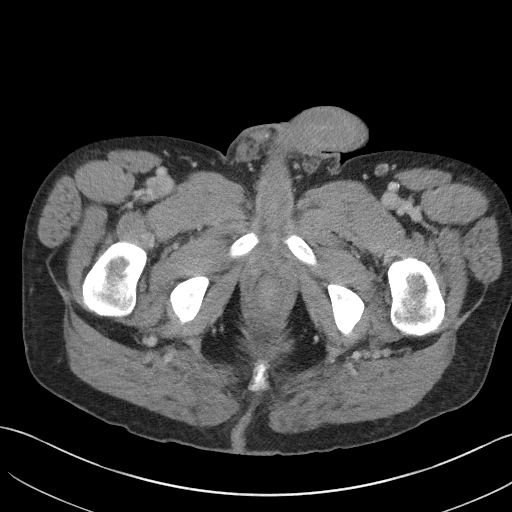
[im 22/92  soft-tissue]
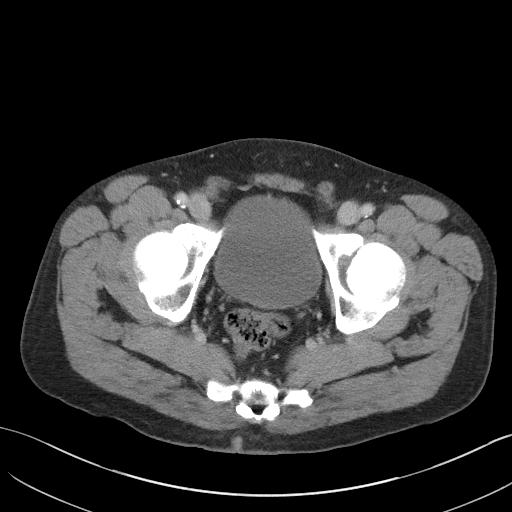
[im 27/92  soft-tissue]
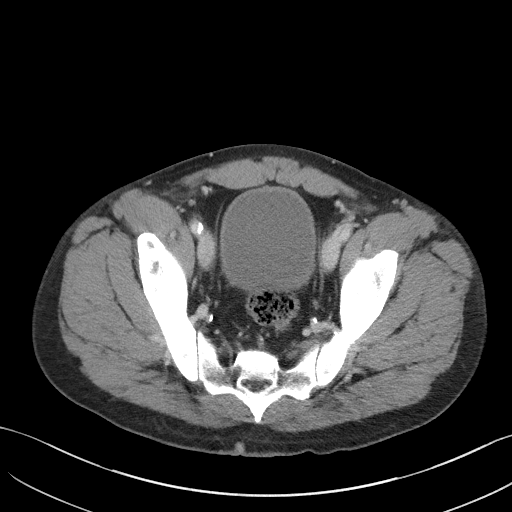
[im 33/92  soft-tissue]
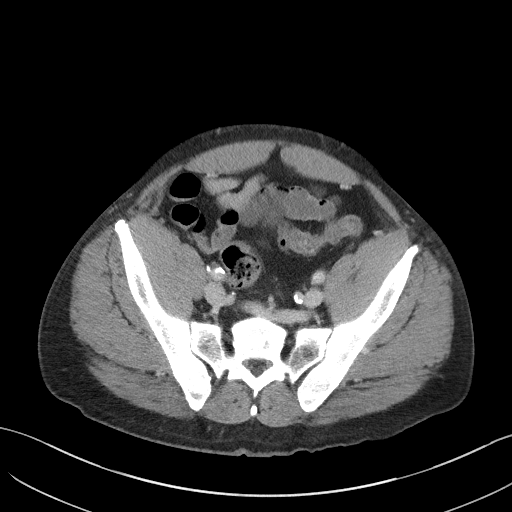
[im 38/92  soft-tissue]
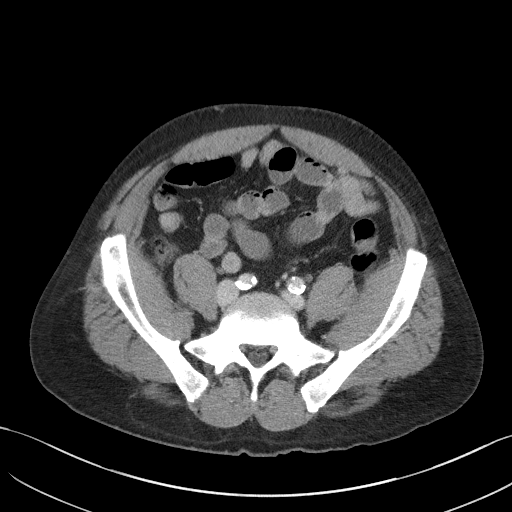
[im 49/92  soft-tissue]
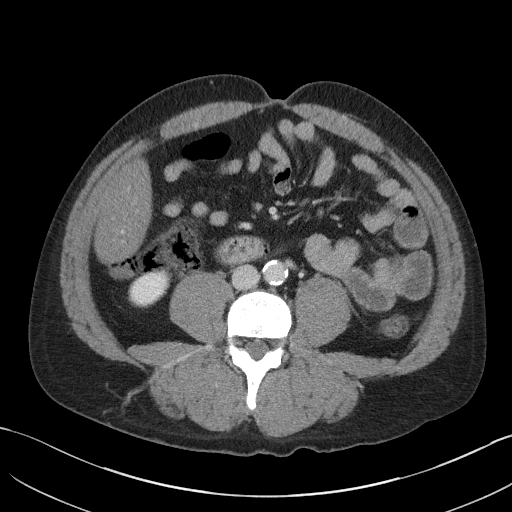
[im 54/92  soft-tissue]
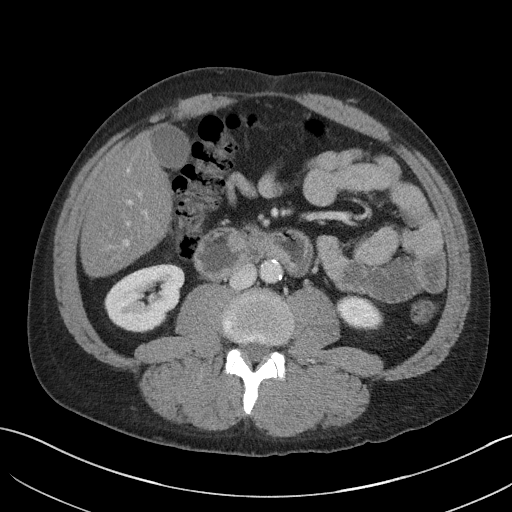
[im 59/92  soft-tissue]
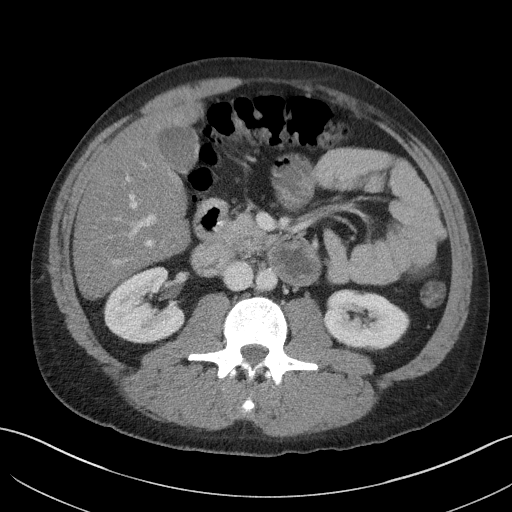
[im 59/92  bone]
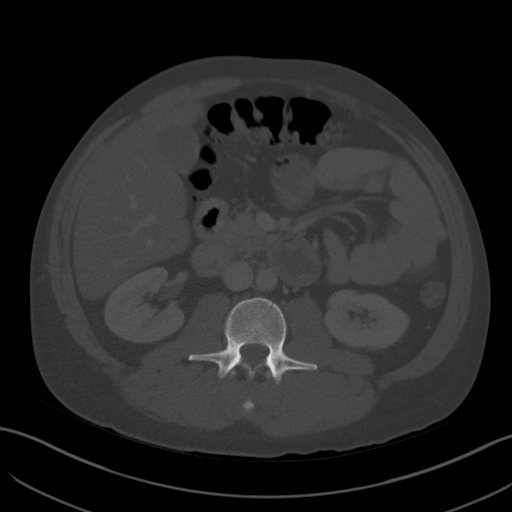
[im 65/92  soft-tissue]
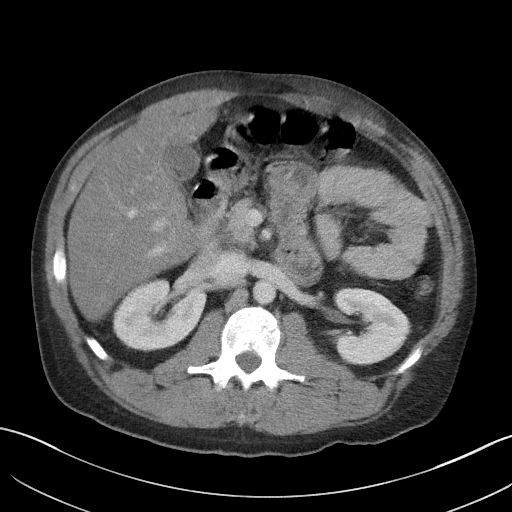
[im 70/92  soft-tissue]
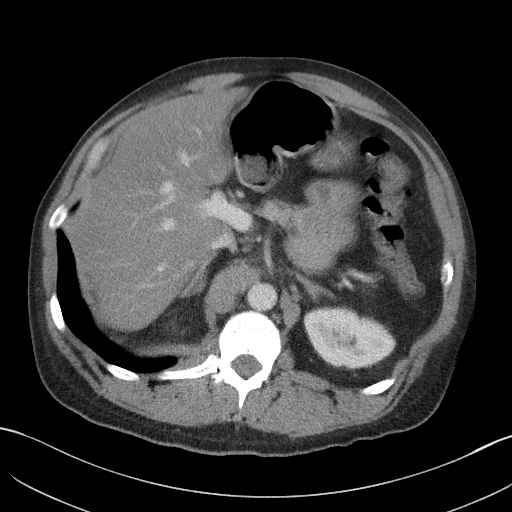
[im 81/92  soft-tissue]
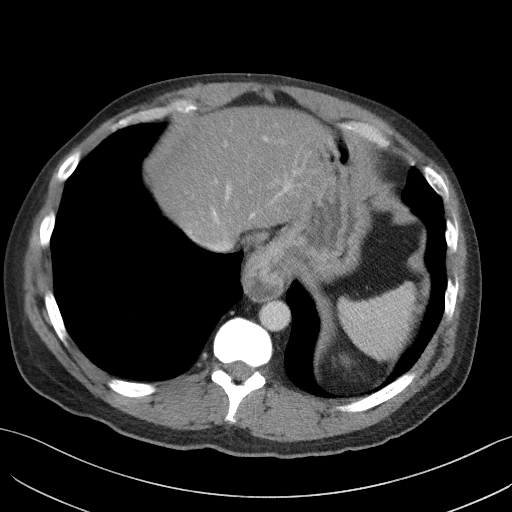
[im 86/92  soft-tissue]
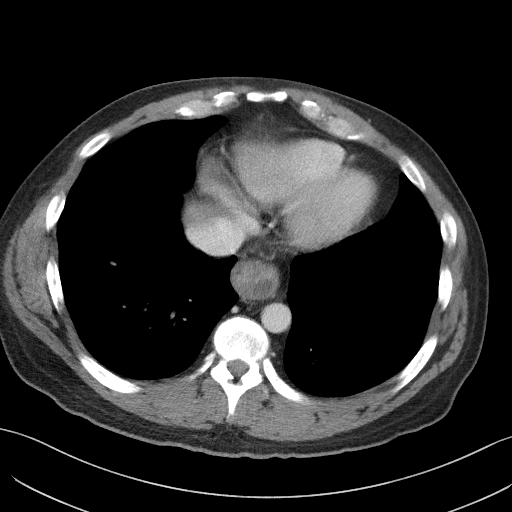

[Series 5: coronal st · coronal · 0.73mm/px · 3 of 107 slices shown]
[im 36/107  soft-tissue]
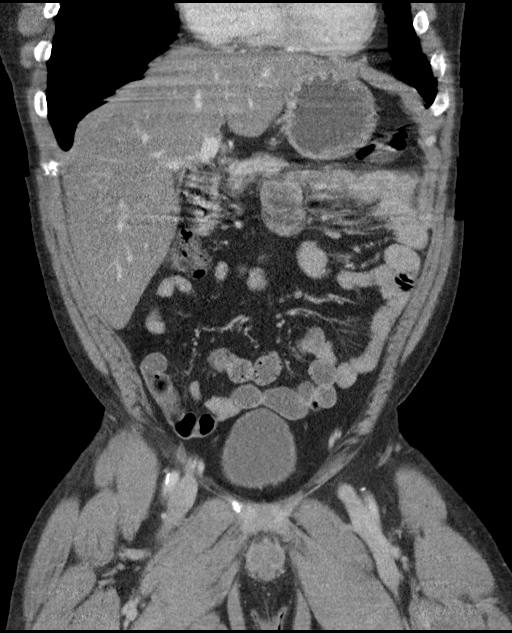
[im 48/107  soft-tissue]
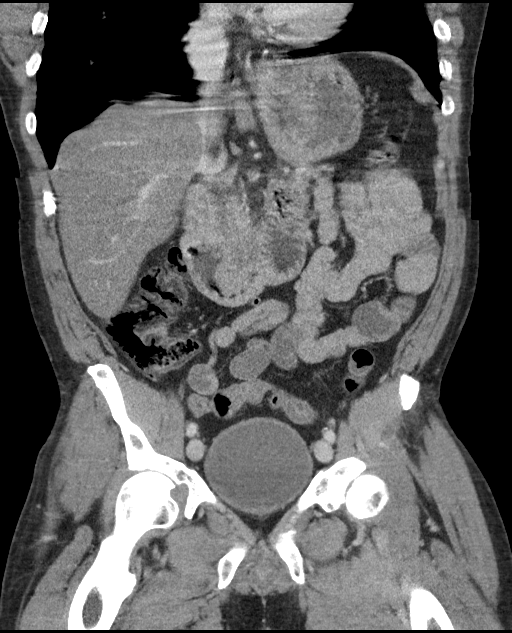
[im 59/107  soft-tissue]
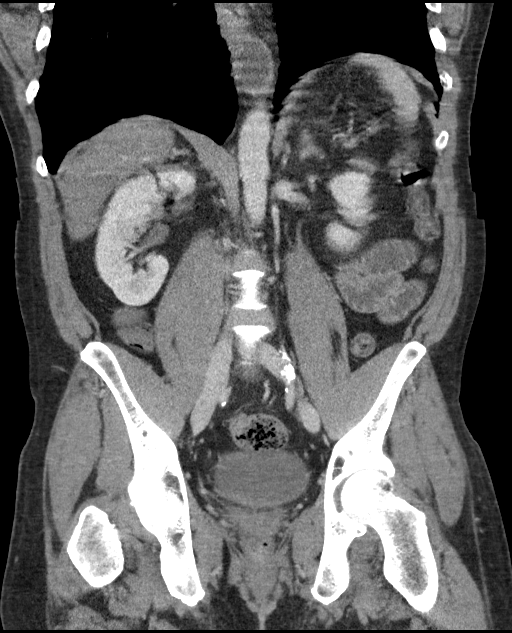

[16 of 46 positions shown; findings below may reference images not displayed]

FINDINGS: Lower chest: Study mildly degraded by motion artifact.

Visualized lung bases are clear.

Hepatobiliary: Diffuse hypoattenuation of the liver consistent with
steatosis. 15 mm wedge-shaped hypervascular lesion within the
subcapsular right hepatic lobe felt to be most consistent with AV
shunting, similar to previous. Liver otherwise unremarkable.
Gallbladder within normal limits. No biliary dilatation.

Pancreas: Pancreas grossly unremarkable, although evaluation limited
by motion.

Spleen: Spleen within normal limits.

Adrenals/Urinary Tract: Adrenal glands are normal. Kidneys equal
size with symmetric enhancement. No nephrolithiasis, hydronephrosis,
or focal enhancing renal mass. No hydroureter. Bladder within normal
limits.

Stomach/Bowel: Moderate hiatal hernia. Stomach otherwise
unremarkable. No evidence for bowel obstruction. Appendix within
normal limits. No acute inflammatory changes seen about the bowels.

Vascular/Lymphatic: Moderate aorto bi-iliac atherosclerotic disease.
No aneurysm. Normal intravascular enhancement seen throughout the
intra-abdominal aorta and its branch vessels. No adenopathy.

Reproductive: Prostate normal.

Other: No free air or fluid.

Musculoskeletal: No acute osseus abnormality. No worrisome lytic or
blastic osseous lesions.
IMPRESSION: 1. No CT evidence for acute intra-abdominal or pelvic process.
2. Normal appendix.
3. Hepatic steatosis.
4. Moderate hiatal hernia.
5. Moderate aorto bi-iliac atherosclerotic disease.

## 2021-06-29 ENCOUNTER — Ambulatory Visit (INDEPENDENT_AMBULATORY_CARE_PROVIDER_SITE_OTHER): Payer: Self-pay | Admitting: Family Medicine

## 2021-06-29 ENCOUNTER — Encounter: Payer: Self-pay | Admitting: Family Medicine

## 2021-06-29 VITALS — BP 123/87 | HR 87 | Ht 67.0 in | Wt 168.0 lb

## 2021-06-29 DIAGNOSIS — G40802 Other epilepsy, not intractable, without status epilepticus: Secondary | ICD-10-CM

## 2021-06-29 DIAGNOSIS — G40909 Epilepsy, unspecified, not intractable, without status epilepticus: Secondary | ICD-10-CM | POA: Insufficient documentation

## 2021-06-29 DIAGNOSIS — R7301 Impaired fasting glucose: Secondary | ICD-10-CM

## 2021-06-29 DIAGNOSIS — R0602 Shortness of breath: Secondary | ICD-10-CM

## 2021-06-29 DIAGNOSIS — Z1211 Encounter for screening for malignant neoplasm of colon: Secondary | ICD-10-CM

## 2021-06-29 DIAGNOSIS — F419 Anxiety disorder, unspecified: Secondary | ICD-10-CM

## 2021-06-29 DIAGNOSIS — E559 Vitamin D deficiency, unspecified: Secondary | ICD-10-CM

## 2021-06-29 DIAGNOSIS — I10 Essential (primary) hypertension: Secondary | ICD-10-CM | POA: Insufficient documentation

## 2021-06-29 DIAGNOSIS — F32A Depression, unspecified: Secondary | ICD-10-CM | POA: Insufficient documentation

## 2021-06-29 DIAGNOSIS — Z0001 Encounter for general adult medical examination with abnormal findings: Secondary | ICD-10-CM

## 2021-06-29 DIAGNOSIS — R0981 Nasal congestion: Secondary | ICD-10-CM

## 2021-06-29 DIAGNOSIS — K219 Gastro-esophageal reflux disease without esophagitis: Secondary | ICD-10-CM

## 2021-06-29 DIAGNOSIS — Z720 Tobacco use: Secondary | ICD-10-CM | POA: Insufficient documentation

## 2021-06-29 DIAGNOSIS — Z114 Encounter for screening for human immunodeficiency virus [HIV]: Secondary | ICD-10-CM

## 2021-06-29 MED ORDER — OMEPRAZOLE 40 MG PO CPDR: 40.0000 mg | DELAYED_RELEASE_CAPSULE | Freq: Every day | ORAL | 1 refills | Status: DC

## 2021-06-29 MED ORDER — AMLODIPINE BESYLATE 5 MG PO TABS: 5.0000 mg | ORAL_TABLET | Freq: Every day | ORAL | 2 refills | Status: DC

## 2021-06-29 MED ORDER — CITALOPRAM HYDROBROMIDE 20 MG PO TABS: 20.0000 mg | ORAL_TABLET | Freq: Every day | ORAL | 2 refills | Status: DC

## 2021-06-29 MED ORDER — FLUTICASONE PROPIONATE 50 MCG/ACT NA SUSP: 2.0000 | Freq: Every day | NASAL | 6 refills | Status: DC

## 2021-06-29 MED ORDER — ALBUTEROL SULFATE HFA 108 (90 BASE) MCG/ACT IN AERS: 2.0000 | INHALATION_SPRAY | Freq: Four times a day (QID) | RESPIRATORY_TRACT | 0 refills | Status: DC | PRN

## 2021-06-29 MED ORDER — LISINOPRIL 10 MG PO TABS: 10.0000 mg | ORAL_TABLET | Freq: Every day | ORAL | 2 refills | Status: DC

## 2021-06-29 NOTE — Assessment & Plan Note (Signed)
-  He would like a referral to neurology for his Epilepsy. - He was diagnosed 8 years ago with Epilepsy and currently takes vimpat -Referral sent

## 2021-06-29 NOTE — Assessment & Plan Note (Signed)
Controlled Medication refilled 

## 2021-06-29 NOTE — Progress Notes (Signed)
New Patient Office Visit  Subjective:  Patient ID: Jonathon Guerrero, male    DOB: January 28, 1970  Age: 51 y.o. MRN: [id number]  CC:  Chief Complaint  Patient presents with   New Patient (Initial Visit)    Pt would like to establish care, states he has epilepsy and needs a referral for this. Needs refills for inhaler and other medications     HPI ELAZAR Guerrero is a 51 y.o. male with past medical history of Epilepsy presents for establishing care. He reports needing refills on his medications and would like a referral to neurology for his Epilepsy. He was diagnosed 8 years ago with Epilepsy and currently takes vimpat. He reports nasal congestion for 1 week. Admit to cutting back on tobacco use. He smokes 1 pack every 2 days.   Past Medical History:  Diagnosis Date   Barrett's esophagus without dysplasia    GERD (gastroesophageal reflux disease)    Helicobacter pylori gastritis 09/2010   completed Prevpac   Hypertension    Reflux esophagitis    severe erosive RE on EGD 09/2010   Seizures [facility])     Past Surgical History:  Procedure Laterality Date   COLONOSCOPY  09/2010   hemorrhoids, small hepatic flexure polyp (ablated)   ESOPHAGOGASTRODUODENOSCOPY  09/2010   severe erosive RE, Barrett's, h.pylori gastritis   POLYPECTOMY  09/09/2010   Dr Jena Gauss    Family History  Problem Relation Age of Onset   Lung cancer Father 28   Ulcers Father    Hypertension Father    Aneurysm Mother 41       Brain Aneurysm   Hypertension Brother    Seizures Son    Anesthesia problems Neg Hx    Hypotension Neg Hx    Malignant hyperthermia Neg Hx    Pseudochol deficiency Neg Hx     Social History   Socioeconomic History   Marital status: Single    Spouse name: Not on file   Number of children: 3   Years of education: Not on file   Highest education level: Not on file  Occupational History   Occupation: Sport and exercise psychologist: [employer]  Tobacco Use   Smoking status: Every Day    Packs/day:  0.75    Years: 9.00    Total pack years: 6.75    Types: Cigarettes   Smokeless tobacco: Never  Vaping Use   Vaping Use: Never used  Substance and Sexual Activity   Alcohol use: Yes    Comment: a pint of liquor per week, 1 pint of liqour a week.  3-4 cans of beer a week   Drug use: Yes    Types: Marijuana    Comment: about once per month per client   Sexual activity: Not on file  Other Topics Concern   Not on file  Social History Narrative   Lives alone   Social Determinants of Health   Financial Resource Strain: Not on file  Food Insecurity: Not on file  Transportation Needs: Not on file  Physical Activity: Not on file  Stress: Not on file  Social Connections: Not on file  Intimate Partner Violence: Not on file    ROS Review of Systems  Constitutional:  Negative for chills, fatigue and fever.  HENT:  Positive for congestion. Negative for sinus pressure, sinus pain, sneezing and sore throat.   Eyes:  Negative for photophobia, pain and redness.  Respiratory:  Positive for shortness of breath. Negative for cough, chest tightness and  wheezing.   Cardiovascular:  Negative for chest pain and palpitations.  Gastrointestinal:  Negative for diarrhea, nausea and vomiting.  Endocrine: Negative for polydipsia, polyphagia and polyuria.  Genitourinary:  Negative for frequency and urgency.  Musculoskeletal:  Negative for back pain and neck pain.  Skin:  Negative for rash and wound.  Neurological:  Negative for dizziness and headaches.  Psychiatric/Behavioral:  Positive for self-injury.     Objective:   Today's Vitals: BP 123/87   Pulse 87   Ht 5\' 7"  (1.702 m)   Wt 168 lb (76.2 kg)   SpO2 98%   BMI 26.31 kg/m   Physical Exam HENT:     Head: Normocephalic.     Right Ear: External ear normal.     Left Ear: External ear normal.     Nose: Congestion present.  Eyes:     Extraocular Movements: Extraocular movements intact.     Pupils: Pupils are equal, round, and reactive to  light.  Cardiovascular:     Rate and Rhythm: Normal rate.     Pulses: Normal pulses.     Heart sounds: Normal heart sounds.  Pulmonary:     Effort: Pulmonary effort is normal.     Breath sounds: Normal breath sounds.  Abdominal:     Palpations: Abdomen is soft.  Musculoskeletal:     Cervical back: No rigidity.     Right lower leg: No edema.     Left lower leg: No edema.  Skin:    Findings: No lesion or rash.  Neurological:     Mental Status: He is alert.  Psychiatric:     Comments: Normal affect     Assessment & Plan:   Problem List Items Addressed This Visit       Cardiovascular and Mediastinum   Essential hypertension    Controlled Medication refilled      Relevant Medications   amLODipine (NORVASC) 5 MG tablet   lisinopril (ZESTRIL) 10 MG tablet     Digestive   GERD (gastroesophageal reflux disease)    Stable Refilled med      Relevant Medications   omeprazole (PRILOSEC) 40 MG capsule     Nervous and Auditory   Epilepsy (HCC)    -He would like a referral to neurology for his Epilepsy. - He was diagnosed 8 years ago with Epilepsy and currently takes vimpat -Referral sent       Relevant Orders   Ambulatory referral to Neurology     Other   Tobacco use    Admit to cutting back on tobacco use. He smokes 1 pack every 2 days.  Smokes about 1pack/2day  Asked about quitting: confirms that he currently smokes cigarettes Advise to quit smoking: Educated about QUITTING to reduce the risk of cancer, cardio and cerebrovascular disease. Assess willingness:  Working on quitting at this time and is cutting back. Assist with counseling and pharmacotherapy: Counseled for 5 minutes and literature provided. Arrange for follow up: follow up in 3 months and continue to offer help.      Anxiety and depression    Refilled med      Relevant Medications   citalopram (CELEXA) 20 MG tablet   Other Visit Diagnoses     Colon cancer screening    -  Primary    Relevant Orders   Ambulatory referral to Gastroenterology   SOB (shortness of breath)       Relevant Medications   albuterol (VENTOLIN HFA) 108 (90 Base) MCG/ACT inhaler   Encounter  for screening for HIV       Relevant Orders   HIV antibody (with reflex)   Vitamin D deficiency       Relevant Orders   Vitamin D (25 hydroxy)   IFG (impaired fasting glucose)       Relevant Orders   Hemoglobin A1C   Encounter for general adult medical examination with abnormal findings       Relevant Orders   CBC with Differential/Platelet   CMP14+EGFR   TSH + free T4   Lipid Profile   Nasal congestion       Relevant Medications   fluticasone (FLONASE) 50 MCG/ACT nasal spray       Outpatient Encounter Medications as of 06/29/2021  Medication Sig   albuterol (VENTOLIN HFA) 108 (90 Base) MCG/ACT inhaler Inhale 2 puffs into the lungs every 6 (six) hours as needed for wheezing or shortness of breath.   fluticasone (FLONASE) 50 MCG/ACT nasal spray Place 2 sprays into both nostrils daily.   [DISCONTINUED] amLODipine (NORVASC) 5 MG tablet Take 1 tablet by mouth daily.   [DISCONTINUED] citalopram (CELEXA) 20 MG tablet Take by mouth.   [DISCONTINUED] lisinopril (ZESTRIL) 10 MG tablet Take 1 tablet by mouth daily.   [DISCONTINUED] omeprazole (PRILOSEC) 40 MG capsule    amLODipine (NORVASC) 5 MG tablet Take 1 tablet (5 mg total) by mouth daily.   citalopram (CELEXA) 20 MG tablet Take 1 tablet (20 mg total) by mouth daily.   HYDROcodone-acetaminophen (NORCO/VICODIN) 5-325 MG tablet Take 1-2 tablets every 6 (six) hours as needed by mouth. (Patient not taking: Reported on 06/29/2021)   lisinopril (PRINIVIL,ZESTRIL) 10 MG tablet Take 1 tablet (10 mg total) by mouth daily. (Patient not taking: Reported on 06/29/2021)   lisinopril (ZESTRIL) 10 MG tablet Take 1 tablet (10 mg total) by mouth daily.   omeprazole (PRILOSEC) 40 MG capsule Take 1 capsule (40 mg total) by mouth daily.   No facility-administered encounter  medications on file as of 06/29/2021.    Follow-up: Return in about 3 months (around 09/29/2021).   Gilmore Laroche, FNP

## 2021-07-01 ENCOUNTER — Encounter: Payer: Self-pay | Admitting: *Deleted

## 2021-09-29 ENCOUNTER — Ambulatory Visit: Payer: Medicaid Other | Admitting: Family Medicine

## 2022-02-26 ENCOUNTER — Encounter (HOSPITAL_COMMUNITY): Payer: Self-pay | Admitting: Emergency Medicine

## 2022-02-26 ENCOUNTER — Emergency Department (HOSPITAL_COMMUNITY): Payer: Self-pay

## 2022-02-26 ENCOUNTER — Inpatient Hospital Stay (HOSPITAL_COMMUNITY)
Admission: EM | Admit: 2022-02-26 | Discharge: 2022-03-01 | DRG: 897 | Disposition: A | Discharge status: Home Health | Payer: Self-pay | Attending: Internal Medicine | Admitting: Internal Medicine

## 2022-02-26 ENCOUNTER — Other Ambulatory Visit: Payer: Self-pay

## 2022-02-26 DIAGNOSIS — D539 Nutritional anemia, unspecified: Secondary | ICD-10-CM | POA: Diagnosis present

## 2022-02-26 DIAGNOSIS — Z801 Family history of malignant neoplasm of trachea, bronchus and lung: Secondary | ICD-10-CM

## 2022-02-26 DIAGNOSIS — F1010 Alcohol abuse, uncomplicated: Principal | ICD-10-CM | POA: Diagnosis present

## 2022-02-26 DIAGNOSIS — Z1152 Encounter for screening for COVID-19: Secondary | ICD-10-CM

## 2022-02-26 DIAGNOSIS — J309 Allergic rhinitis, unspecified: Secondary | ICD-10-CM | POA: Diagnosis present

## 2022-02-26 DIAGNOSIS — I10 Essential (primary) hypertension: Secondary | ICD-10-CM | POA: Diagnosis present

## 2022-02-26 DIAGNOSIS — F10129 Alcohol abuse with intoxication, unspecified: Secondary | ICD-10-CM | POA: Diagnosis present

## 2022-02-26 DIAGNOSIS — Y901 Blood alcohol level of 20-39 mg/100 ml: Secondary | ICD-10-CM | POA: Diagnosis present

## 2022-02-26 DIAGNOSIS — E876 Hypokalemia: Secondary | ICD-10-CM | POA: Diagnosis present

## 2022-02-26 DIAGNOSIS — Z8249 Family history of ischemic heart disease and other diseases of the circulatory system: Secondary | ICD-10-CM

## 2022-02-26 DIAGNOSIS — F10139 Alcohol abuse with withdrawal, unspecified: Principal | ICD-10-CM | POA: Diagnosis present

## 2022-02-26 DIAGNOSIS — F10939 Alcohol use, unspecified with withdrawal, unspecified: Secondary | ICD-10-CM | POA: Diagnosis present

## 2022-02-26 DIAGNOSIS — R160 Hepatomegaly, not elsewhere classified: Secondary | ICD-10-CM | POA: Diagnosis present

## 2022-02-26 DIAGNOSIS — K2100 Gastro-esophageal reflux disease with esophagitis, without bleeding: Secondary | ICD-10-CM | POA: Diagnosis present

## 2022-02-26 DIAGNOSIS — F419 Anxiety disorder, unspecified: Secondary | ICD-10-CM | POA: Diagnosis present

## 2022-02-26 DIAGNOSIS — K219 Gastro-esophageal reflux disease without esophagitis: Secondary | ICD-10-CM | POA: Diagnosis present

## 2022-02-26 DIAGNOSIS — F10930 Alcohol use, unspecified with withdrawal, uncomplicated: Secondary | ICD-10-CM

## 2022-02-26 DIAGNOSIS — F17210 Nicotine dependence, cigarettes, uncomplicated: Secondary | ICD-10-CM | POA: Diagnosis present

## 2022-02-26 DIAGNOSIS — K2270 Barrett's esophagus without dysplasia: Secondary | ICD-10-CM | POA: Diagnosis present

## 2022-02-26 DIAGNOSIS — K760 Fatty (change of) liver, not elsewhere classified: Secondary | ICD-10-CM | POA: Diagnosis present

## 2022-02-26 DIAGNOSIS — Z79899 Other long term (current) drug therapy: Secondary | ICD-10-CM

## 2022-02-26 DIAGNOSIS — J301 Allergic rhinitis due to pollen: Secondary | ICD-10-CM

## 2022-02-26 DIAGNOSIS — Z720 Tobacco use: Secondary | ICD-10-CM | POA: Diagnosis present

## 2022-02-26 DIAGNOSIS — F32A Depression, unspecified: Secondary | ICD-10-CM | POA: Diagnosis present

## 2022-02-26 DIAGNOSIS — G629 Polyneuropathy, unspecified: Secondary | ICD-10-CM | POA: Diagnosis present

## 2022-02-26 LAB — RESP PANEL BY RT-PCR (RSV, FLU A&B, COVID)  RVPGX2
Influenza A by PCR: NEGATIVE
Influenza B by PCR: NEGATIVE
Resp Syncytial Virus by PCR: NEGATIVE
SARS Coronavirus 2 by RT PCR: NEGATIVE

## 2022-02-26 LAB — CBC WITH DIFFERENTIAL/PLATELET
Abs Immature Granulocytes: 0.03 10*3/uL (ref 0.00–0.07)
Basophils Absolute: 0 10*3/uL (ref 0.0–0.1)
Basophils Relative: 1 %
Eosinophils Absolute: 0 10*3/uL (ref 0.0–0.5)
Eosinophils Relative: 1 %
HCT: 30.2 % — ABNORMAL LOW (ref 39.0–52.0)
Hemoglobin: 10 g/dL — ABNORMAL LOW (ref 13.0–17.0)
Immature Granulocytes: 1 %
Lymphocytes Relative: 25 %
Lymphs Abs: 1.2 10*3/uL (ref 0.7–4.0)
MCH: 36.5 pg — ABNORMAL HIGH (ref 26.0–34.0)
MCHC: 33.1 g/dL (ref 30.0–36.0)
MCV: 110.2 fL — ABNORMAL HIGH (ref 80.0–100.0)
Monocytes Absolute: 0.6 10*3/uL (ref 0.1–1.0)
Monocytes Relative: 12 %
Neutro Abs: 3 10*3/uL (ref 1.7–7.7)
Neutrophils Relative %: 60 %
Platelets: 103 10*3/uL — ABNORMAL LOW (ref 150–400)
RBC: 2.74 MIL/uL — ABNORMAL LOW (ref 4.22–5.81)
RDW: 14.5 % (ref 11.5–15.5)
WBC: 4.8 10*3/uL (ref 4.0–10.5)
nRBC: 0 % (ref 0.0–0.2)

## 2022-02-26 LAB — COMPREHENSIVE METABOLIC PANEL
ALT: 18 U/L (ref 0–44)
AST: 52 U/L — ABNORMAL HIGH (ref 15–41)
Albumin: 3.6 g/dL (ref 3.5–5.0)
Alkaline Phosphatase: 92 U/L (ref 38–126)
Anion gap: 11 (ref 5–15)
BUN: <5 mg/dL — ABNORMAL LOW (ref 6–20)
CO2: 24 mmol/L (ref 22–32)
Calcium: 8.5 mg/dL — ABNORMAL LOW (ref 8.9–10.3)
Chloride: 103 mmol/L (ref 98–111)
Creatinine, Ser: 0.81 mg/dL (ref 0.61–1.24)
GFR, Estimated: >60 mL/min (ref >60)
Glucose, Bld: 87 mg/dL (ref 70–99)
Potassium: 2.7 mmol/L — CL (ref 3.5–5.1)
Sodium: 138 mmol/L (ref 135–145)
Total Bilirubin: 1 mg/dL (ref 0.3–1.2)
Total Protein: 6.6 g/dL (ref 6.5–8.1)

## 2022-02-26 LAB — ETHANOL: Alcohol, Ethyl (B): 39 mg/dL — ABNORMAL HIGH (ref <10)

## 2022-02-26 LAB — AMMONIA: Ammonia: 39 umol/L — ABNORMAL HIGH (ref 9–35)

## 2022-02-26 MED ORDER — ACETAMINOPHEN 325 MG PO TABS: 650.0000 mg | ORAL_TABLET | Freq: Four times a day (QID) | ORAL | Status: DC | PRN

## 2022-02-26 MED ORDER — POTASSIUM CHLORIDE 10 MEQ/100ML IV SOLN
10.0000 meq | INTRAVENOUS | Status: AC
  Administered 2022-02-26 (×3): 10 meq via INTRAVENOUS
  Filled 2022-02-26 (×3): qty 100

## 2022-02-26 MED ORDER — POTASSIUM CHLORIDE CRYS ER 20 MEQ PO TBCR
40.0000 meq | EXTENDED_RELEASE_TABLET | Freq: Once | ORAL | Status: AC
  Administered 2022-02-27: 40 meq via ORAL
  Filled 2022-02-26: qty 2

## 2022-02-26 MED ORDER — THIAMINE MONONITRATE 100 MG PO TABS: 100.0000 mg | ORAL_TABLET | Freq: Every day | ORAL | Status: DC

## 2022-02-26 MED ORDER — THIAMINE HCL 100 MG/ML IJ SOLN
100.0000 mg | Freq: Once | INTRAMUSCULAR | Status: AC
  Administered 2022-02-27: 100 mg via INTRAVENOUS
  Filled 2022-02-26: qty 2

## 2022-02-26 MED ORDER — MELATONIN 3 MG PO TABS: 3.0000 mg | ORAL_TABLET | Freq: Every evening | ORAL | Status: DC | PRN

## 2022-02-26 MED ORDER — THIAMINE HCL 100 MG/ML IJ SOLN
100.0000 mg | Freq: Every day | INTRAMUSCULAR | Status: DC
  Filled 2022-02-26: qty 2

## 2022-02-26 MED ORDER — THIAMINE MONONITRATE 100 MG PO TABS
100.0000 mg | ORAL_TABLET | Freq: Every day | ORAL | Status: DC
  Administered 2022-02-27 – 2022-03-01 (×3): 100 mg via ORAL
  Filled 2022-02-26 (×3): qty 1

## 2022-02-26 MED ORDER — FOLIC ACID 1 MG PO TABS
1.0000 mg | ORAL_TABLET | Freq: Every day | ORAL | Status: DC
  Administered 2022-02-27 – 2022-03-01 (×3): 1 mg via ORAL
  Filled 2022-02-26 (×3): qty 1

## 2022-02-26 MED ORDER — ACETAMINOPHEN 650 MG RE SUPP: 650.0000 mg | Freq: Four times a day (QID) | RECTAL | Status: DC | PRN

## 2022-02-26 MED ORDER — ADULT MULTIVITAMIN W/MINERALS CH
1.0000 | ORAL_TABLET | Freq: Every day | ORAL | Status: DC
  Administered 2022-02-28 – 2022-03-01 (×2): 1 via ORAL
  Filled 2022-02-26 (×3): qty 1

## 2022-02-26 MED ORDER — THIAMINE HCL 100 MG/ML IJ SOLN: 100.0000 mg | Freq: Every day | INTRAMUSCULAR | Status: DC

## 2022-02-26 MED ORDER — LORAZEPAM 2 MG/ML IJ SOLN
1.0000 mg | INTRAMUSCULAR | Status: DC | PRN
  Administered 2022-02-27: 1 mg via INTRAVENOUS
  Filled 2022-02-26: qty 1

## 2022-02-26 MED ORDER — LORAZEPAM 1 MG PO TABS
1.0000 mg | ORAL_TABLET | ORAL | Status: DC | PRN
  Administered 2022-02-28: 2 mg via ORAL
  Filled 2022-02-26: qty 2

## 2022-02-26 NOTE — ED Provider Notes (Signed)
[facility] Provider Note   CSN: [id number] Arrival date & time: 02/26/22  1542     History  Chief Complaint  Patient presents with   Alcohol Problem    Jonathon Guerrero is a 52 y.o. male.  The history is provided by the patient and medical records. No language interpreter was used.  Alcohol Problem This is a chronic problem. The problem occurs constantly. The problem has not changed since onset.Pertinent negatives include no chest pain, no abdominal pain, no headaches and no shortness of breath. Nothing aggravates the symptoms. Nothing relieves the symptoms. He has tried nothing for the symptoms. The treatment provided no relief.       Home Medications Prior to Admission medications   Medication Sig Start Date End Date Taking? Authorizing Provider  albuterol (VENTOLIN HFA) 108 (90 Base) MCG/ACT inhaler Inhale 2 puffs into the lungs every 6 (six) hours as needed for wheezing or shortness of breath. 06/29/21   Alvira Monday, FNP  amLODipine (NORVASC) 5 MG tablet Take 1 tablet (5 mg total) by mouth daily. 06/29/21   Alvira Monday, FNP  citalopram (CELEXA) 20 MG tablet Take 1 tablet (20 mg total) by mouth daily. 06/29/21   Alvira Monday, FNP  fluticasone (FLONASE) 50 MCG/ACT nasal spray Place 2 sprays into both nostrils daily. 06/29/21   Alvira Monday, FNP  HYDROcodone-acetaminophen (NORCO/VICODIN) 5-325 MG tablet Take 1-2 tablets every 6 (six) hours as needed by mouth. Patient not taking: Reported on 06/29/2021 11/21/16   Forde Dandy, MD  lisinopril (PRINIVIL,ZESTRIL) 10 MG tablet Take 1 tablet (10 mg total) by mouth daily. Patient not taking: Reported on 06/29/2021 09/29/16   Soyla Dryer, PA-C  lisinopril (ZESTRIL) 10 MG tablet Take 1 tablet (10 mg total) by mouth daily. 06/29/21   Alvira Monday, FNP  omeprazole (PRILOSEC) 40 MG capsule Take 1 capsule (40 mg total) by mouth daily. 06/29/21   Alvira Monday, FNP      Allergies     Sardine flavor and Poison sumac extract    Review of Systems   Review of Systems  Constitutional:  Negative for chills, fatigue and fever.  HENT:  Negative for congestion.   Respiratory:  Negative for cough, chest tightness, shortness of breath and wheezing.   Cardiovascular:  Negative for chest pain.  Gastrointestinal:  Negative for abdominal pain, constipation, diarrhea, nausea and vomiting.  Genitourinary:  Negative for dysuria and flank pain.  Musculoskeletal:  Negative for back pain, neck pain and neck stiffness.  Skin:  Negative for rash and wound.  Neurological:  Positive for tremors and seizures. Negative for weakness, light-headedness and headaches.  Psychiatric/Behavioral:  Positive for hallucinations. Negative for agitation.   All other systems reviewed and are negative.   Physical Exam Updated Vital Signs BP 132/86   Pulse (!) 103   Temp 98.2 F (36.8 C) (Oral)   Resp 18   Ht '5\' 7"'$  (1.702 m)   Wt 81.6 kg   SpO2 100%   BMI 28.19 kg/m  Physical Exam Vitals and nursing note reviewed.  Constitutional:      General: He is not in acute distress.    Appearance: He is well-developed. He is not ill-appearing, toxic-appearing or diaphoretic.  HENT:     Head: Normocephalic and atraumatic.     Nose: No congestion or rhinorrhea.     Mouth/Throat:     Mouth: Mucous membranes are moist.     Pharynx: No oropharyngeal exudate or posterior oropharyngeal erythema.  Eyes:     Extraocular Movements: Extraocular movements intact.     Conjunctiva/sclera: Conjunctivae normal.     Pupils: Pupils are equal, round, and reactive to light.  Cardiovascular:     Rate and Rhythm: Normal rate and regular rhythm.     Heart sounds: No murmur heard. Pulmonary:     Effort: Pulmonary effort is normal. No respiratory distress.     Breath sounds: Normal breath sounds. No wheezing, rhonchi or rales.  Chest:     Chest wall: No tenderness.  Abdominal:     General: Abdomen is flat.      Palpations: Abdomen is soft.     Tenderness: There is no abdominal tenderness. There is no right CVA tenderness, left CVA tenderness, guarding or rebound.  Musculoskeletal:        General: No swelling or tenderness.     Cervical back: Neck supple. No tenderness.  Skin:    General: Skin is warm and dry.     Capillary Refill: Capillary refill takes less than 2 seconds.     Findings: No erythema or rash.  Neurological:     General: No focal deficit present.     Mental Status: He is alert.  Psychiatric:        Mood and Affect: Mood normal.     ED Results / Procedures / Treatments   Labs (all labs ordered are listed, but only abnormal results are displayed) Labs Reviewed  ETHANOL - Abnormal; Notable for the following components:      Result Value   Alcohol, Ethyl (B) 39 (*)    All other components within normal limits  COMPREHENSIVE METABOLIC PANEL - Abnormal; Notable for the following components:   Potassium 2.7 (*)    BUN <5 (*)    Calcium 8.5 (*)    AST 52 (*)    All other components within normal limits  CBC WITH DIFFERENTIAL/PLATELET - Abnormal; Notable for the following components:   RBC 2.74 (*)    Hemoglobin 10.0 (*)    HCT 30.2 (*)    MCV 110.2 (*)    MCH 36.5 (*)    Platelets 103 (*)    All other components within normal limits  AMMONIA - Abnormal; Notable for the following components:   Ammonia 39 (*)    All other components within normal limits  RESP PANEL BY RT-PCR (RSV, FLU A&B, COVID)  [id number]  CBC WITH DIFFERENTIAL/PLATELET  COMPREHENSIVE METABOLIC PANEL  MAGNESIUM  MAGNESIUM  PHOSPHORUS    EKG None  Radiology CT Head Wo Contrast  Result Date: 02/26/2022 CLINICAL DATA:  52 year old male with recent falls.  Alcohol abuse. EXAM: CT HEAD WITHOUT CONTRAST TECHNIQUE: Contiguous axial images were obtained from the base of the skull through the vertex without intravenous contrast. RADIATION DOSE REDUCTION: This exam was performed according to the  departmental dose-optimization program which includes automated exposure control, adjustment of the mA and/or kV according to patient size and/or use of iterative reconstruction technique. COMPARISON:  09/19/2021 CT and prior studies FINDINGS: Brain: No evidence of acute infarction, hemorrhage, hydrocephalus, extra-axial collection or mass lesion/mass effect. Atrophy and chronic small-vessel white matter ischemic changes again noted. Vascular: No hyperdense vessel or unexpected calcification. Skull: Normal. Negative for fracture or focal lesion. Sinuses/Orbits: No acute finding. Other: None. IMPRESSION: 1. No evidence of acute intracranial abnormality. 2. Atrophy and chronic small-vessel white matter ischemic changes. Electronically Signed   By: Margarette Canada M.D.   On: 02/26/2022 18:41    Procedures Procedures  Medications Ordered in ED Medications  acetaminophen (TYLENOL) tablet 650 mg (has no administration in time range)    Or  acetaminophen (TYLENOL) suppository 650 mg (has no administration in time range)  melatonin tablet 3 mg (has no administration in time range)  LORazepam (ATIVAN) tablet 1-4 mg (has no administration in time range)    Or  LORazepam (ATIVAN) injection 1-4 mg (has no administration in time range)  folic acid (FOLVITE) tablet 1 mg (has no administration in time range)  multivitamin with minerals tablet 1 tablet (has no administration in time range)  thiamine (VITAMIN B1) injection 100 mg (has no administration in time range)  potassium chloride SA (KLOR-CON M) CR tablet 40 mEq (has no administration in time range)  thiamine (VITAMIN B1) tablet 100 mg (has no administration in time range)    Or  thiamine (VITAMIN B1) injection 100 mg (has no administration in time range)  potassium chloride 10 mEq in 100 mL IVPB (0 mEq Intravenous Stopped 02/26/22 2256)    ED Course/ Medical Decision Making/ A&P                             Medical Decision Making Amount and/or  Complexity of Data Reviewed Labs: ordered.  Risk Prescription drug management. Decision regarding hospitalization.    Jonathon Guerrero is a 52 y.o. male with a past medical history significant for hypertension, anxiety, depression, seizures, Barrett's esophagus, and alcohol abuse who presents for fall and alcohol abuse.  According to family who helped bring him in, patient was admitted several days ago for alcohol withdrawal seizure.  He was reportedly admitted at [facility] and ended up leaving AMA.  He says that he had "a little bit of beer" today.  He said that he had a fall several days ago and hit his head but is denying any headache.  Family says that he is continue to drink and is having intermittent episodes where he is having diffuse shaking and going into withdrawals.  Patient says that he does start having shaking, feeling tactile hallucinations and visual elucidation's and feels like he might have a seizure.  He is not feeling it now because he drank earlier.  He currently denies any chest pain, palpitations, shortness of breath.  Denies nausea, vomiting, constipation, diarrhea.  Denies other infectious symptoms.  Patient family they brought him here because they are concerned about him going back into withdrawals and him needing alcohol assistance.  On my exam, lungs were clear and chest was nontender.  Abdomen nontender.  Patient does not have evidence of acute trauma initially.  Intact sensation and strength, and pulses in extremities.  Pupils are symmetric and reactive normal extract movements.  Patient is answering questions appropriately.  Given the patient's report that he had an alcohol withdrawal seizure several days ago for he was admitted and left AMA and is now drinking again and feels like he is going to go into alcohol withdrawal, I do feel need to closely monitor patient and may need admission to prevent seizures.  Patient is not having significant withdrawal  symptoms at this time but family is concerned it may return shortly.  Will get head CT to look for traumatic injury from his recent fall and will get screening labs.  Patient had some labs in triage that revealed a potassium of 2.7.  Will order IV potassium.  With his hypokalemia and concern for pending withdrawal and recurrent seizures as  he reports he is not on any seizure medication, I do feel patient will likely need admission.  Anticipate reassessment after CT head and labs and then will likely call for admission.  Patient's workup continues to return.  His viral testing was negative.  Ammonia barely elevated at 39.  CT head did not show acute traumatic injuries.  Alcohol was elevated at 39 so anticipate as his alcohol metabolizes he will likely start to go into withdrawals leading to seizures as he has in the past.  Due to his story over the last several days, will call for admission for monitoring and further management.         Final Clinical Impression(s) / ED Diagnoses Final diagnoses:  Alcohol abuse    Rx / DC Orders ED Discharge Orders     None       Clinical Impression: 1. Alcohol abuse     Disposition: Admit  This note was prepared with assistance of Dragon voice recognition software. Occasional wrong-word or sound-a-like substitutions may have occurred due to the inherent limitations of voice recognition software.     Zali Kamaka, Gwenyth Allegra, MD 02/26/22 [phone]

## 2022-02-26 NOTE — ED Triage Notes (Signed)
Pt requesting detox from alcohol. Pt states he drinks pint of liquor every other day. Last drink 5 days ago.

## 2022-02-26 NOTE — H&P (Signed)
History and Physical      Jonathon Guerrero [id number] DOB: 08/04/1970 DOA: 02/26/2022  PCP: Alvira Monday, FNP *** Patient coming from: home ***  I have personally briefly reviewed patient's old medical records in [place]  Chief Complaint: ***  HPI: Jonathon Guerrero is a 52 y.o. male with medical history significant for *** who is admitted to [facility] on 02/26/2022 with *** after presenting from home*** to [facility] ED complaining of ***.    ***       ***   ED Course:  Vital signs in the ED were notable for the following: ***  Labs were notable for the following: ***  Per my interpretation, EKG in ED demonstrated the following:  ***  Imaging and additional notable ED work-up: ***  While in the ED, the following were administered: ***  Subsequently, the patient was admitted  ***  ***red    Review of Systems: As per HPI otherwise 10 point review of systems negative.   Past Medical History:  Diagnosis Date   Barrett's esophagus without dysplasia    GERD (gastroesophageal reflux disease)    Helicobacter pylori gastritis 09/2010   completed Prevpac   Hypertension    Reflux esophagitis    severe erosive RE on EGD 09/2010   Seizures [facility])     Past Surgical History:  Procedure Laterality Date   COLONOSCOPY  09/2010   hemorrhoids, small hepatic flexure polyp (ablated)   ESOPHAGOGASTRODUODENOSCOPY  09/2010   severe erosive RE, Barrett's, h.pylori gastritis   POLYPECTOMY  09/09/2010   Dr Gala Romney    Social History:  reports that he has been smoking cigarettes. He has a 6.75 pack-year smoking history. He has never used smokeless tobacco. He reports current alcohol use. He reports current drug use. Drug: Marijuana.   Allergies  Allergen Reactions   Sardine Flavor    Poison Sumac Extract Itching, Swelling and Rash    Family History  Problem Relation Age of Onset   Lung cancer Father 62   Ulcers Father    Hypertension Father    Aneurysm Mother  40       Brain Aneurysm   Hypertension Brother    Seizures Son    Anesthesia problems Neg Hx    Hypotension Neg Hx    Malignant hyperthermia Neg Hx    Pseudochol deficiency Neg Hx     Family history reviewed and not pertinent ***   Prior to Admission medications   Medication Sig Start Date End Date Taking? Authorizing Provider  albuterol (VENTOLIN HFA) 108 (90 Base) MCG/ACT inhaler Inhale 2 puffs into the lungs every 6 (six) hours as needed for wheezing or shortness of breath. 06/29/21   Alvira Monday, FNP  amLODipine (NORVASC) 5 MG tablet Take 1 tablet (5 mg total) by mouth daily. 06/29/21   Alvira Monday, FNP  citalopram (CELEXA) 20 MG tablet Take 1 tablet (20 mg total) by mouth daily. 06/29/21   Alvira Monday, FNP  fluticasone (FLONASE) 50 MCG/ACT nasal spray Place 2 sprays into both nostrils daily. 06/29/21   Alvira Monday, FNP  HYDROcodone-acetaminophen (NORCO/VICODIN) 5-325 MG tablet Take 1-2 tablets every 6 (six) hours as needed by mouth. Patient not taking: Reported on 06/29/2021 11/21/16   Forde Dandy, MD  lisinopril (PRINIVIL,ZESTRIL) 10 MG tablet Take 1 tablet (10 mg total) by mouth daily. Patient not taking: Reported on 06/29/2021 09/29/16   Soyla Dryer, PA-C  lisinopril (ZESTRIL) 10 MG tablet Take 1 tablet (10 mg total) by  mouth daily. 06/29/21   Alvira Monday, FNP  omeprazole (PRILOSEC) 40 MG capsule Take 1 capsule (40 mg total) by mouth daily. 06/29/21   Alvira Monday, FNP     Objective    Physical Exam: Vitals:   02/26/22 1546 02/26/22 1553 02/26/22 1956  BP: 132/86  (!) 143/97  Pulse: (!) 103  94  Resp: 18  18  Temp: 98.2 F (36.8 C)  98.9 F (37.2 C)  TempSrc: Oral  Oral  SpO2: 100%  100%  Weight:  81.6 kg   Height:  '5\' 7"'$  (1.702 m)     General: appears to be stated age; alert, oriented Skin: warm, dry, no rash Head:  AT/[place] Mouth:  Oral mucosa membranes appear moist, normal dentition Neck: supple; trachea midline Heart:  RRR; did not  appreciate any M/R/G Lungs: CTAB, did not appreciate any wheezes, rales, or rhonchi Abdomen: + BS; soft, ND, NT Vascular: 2+ pedal pulses b/l; 2+ radial pulses b/l Extremities: no peripheral edema, no muscle wasting Neuro: strength and sensation intact in upper and lower extremities b/l ***   *** Neuro: 5/5 strength of the proximal and distal flexors and extensors of the upper and lower extremities bilaterally; sensation intact in upper and lower extremities b/l; cranial nerves II through XII grossly intact; no pronator drift; no evidence suggestive of slurred speech, dysarthria, or facial droop; Normal muscle tone. No tremors.  *** Neuro: In the setting of the patient's current mental status and associated inability to follow instructions, unable to perform full neurologic exam at this time.  As such, assessment of strength, sensation, and cranial nerves is limited at this time. Patient noted to spontaneously move all 4 extremities. No tremors.  ***    Labs on Admission: I have personally reviewed following labs and imaging studies  CBC: Recent Labs  Lab 02/26/22 1602  WBC 4.8  NEUTROABS 3.0  HGB 10.0*  HCT 30.2*  MCV 110.2*  PLT [id number]*   Basic Metabolic Panel: Recent Labs  Lab 02/26/22 1602  NA 138  K 2.7*  CL 103  CO2 24  GLUCOSE 87  BUN <5*  CREATININE 0.81  CALCIUM 8.5*   GFR: Estimated Creatinine Clearance: 112.8 mL/min (by C-G formula based on SCr of 0.81 mg/dL). Liver Function Tests: Recent Labs  Lab 02/26/22 1602  AST 52*  ALT 18  ALKPHOS 92  BILITOT 1.0  PROT 6.6  ALBUMIN 3.6   No results for input(s): "LIPASE", "AMYLASE" in the last 168 hours. Recent Labs  Lab 02/26/22 1811  AMMONIA 39*   Coagulation Profile: No results for input(s): "INR", "PROTIME" in the last 168 hours. Cardiac Enzymes: No results for input(s): "CKTOTAL", "CKMB", "CKMBINDEX", "TROPONINI" in the last 168 hours. BNP (last 3 results) No results for input(s): "PROBNP" in the  last 8760 hours. HbA1C: No results for input(s): "HGBA1C" in the last 72 hours. CBG: No results for input(s): "GLUCAP" in the last 168 hours. Lipid Profile: No results for input(s): "CHOL", "HDL", "LDLCALC", "TRIG", "CHOLHDL", "LDLDIRECT" in the last 72 hours. Thyroid Function Tests: No results for input(s): "TSH", "T4TOTAL", "FREET4", "T3FREE", "THYROIDAB" in the last 72 hours. Anemia Panel: No results for input(s): "VITAMINB12", "FOLATE", "FERRITIN", "TIBC", "IRON", "RETICCTPCT" in the last 72 hours. Urine analysis:    Component Value Date/Time   COLORURINE YELLOW 09/01/2010 1626   APPEARANCEUR CLEAR 09/01/2010 1626   LABSPEC 1.015 09/01/2010 1626   PHURINE 8.5 (H) 09/01/2010 1626   GLUCOSEU NEGATIVE 09/01/2010 1626   HGBUR NEGATIVE 09/01/2010 1626  BILIRUBINUR NEGATIVE 09/01/2010 1626   KETONESUR NEGATIVE 09/01/2010 1626   PROTEINUR NEGATIVE 09/01/2010 1626   UROBILINOGEN 1.0 09/01/2010 1626   NITRITE NEGATIVE 09/01/2010 1626   LEUKOCYTESUR NEGATIVE 09/01/2010 1626    Radiological Exams on Admission: CT Head Wo Contrast  Result Date: 02/26/2022 CLINICAL DATA:  52 year old male with recent falls.  Alcohol abuse. EXAM: CT HEAD WITHOUT CONTRAST TECHNIQUE: Contiguous axial images were obtained from the base of the skull through the vertex without intravenous contrast. RADIATION DOSE REDUCTION: This exam was performed according to the departmental dose-optimization program which includes automated exposure control, adjustment of the mA and/or kV according to patient size and/or use of iterative reconstruction technique. COMPARISON:  09/19/2021 CT and prior studies FINDINGS: Brain: No evidence of acute infarction, hemorrhage, hydrocephalus, extra-axial collection or mass lesion/mass effect. Atrophy and chronic small-vessel white matter ischemic changes again noted. Vascular: No hyperdense vessel or unexpected calcification. Skull: Normal. Negative for fracture or focal lesion.  Sinuses/Orbits: No acute finding. Other: None. IMPRESSION: 1. No evidence of acute intracranial abnormality. 2. Atrophy and chronic small-vessel white matter ischemic changes. Electronically Signed   By: Margarette Canada M.D.   On: 02/26/2022 18:41      Assessment/Plan   Principal Problem:   Alcohol withdrawal (HCC)   ***       ***            ***             ***            ***            ***            ***             ***           ***           ***           ***           ***          ***          ***  DVT prophylaxis: SCD's ***  Code Status: Full code*** Family Communication: none*** Disposition Plan: Per Rounding Team Consults called: none***;  Admission status: ***     I SPENT GREATER THAN 75 *** MINUTES IN CLINICAL CARE TIME/MEDICAL DECISION-MAKING IN COMPLETING THIS ADMISSION.      [place] DO [facility]  [facility] [place]   02/26/2022, 10:40 PM   ***

## 2022-02-26 NOTE — ED Provider Triage Note (Signed)
Emergency Medicine Provider Triage Evaluation Note  Jonathon Guerrero , a 52 y.o. male  was evaluated in triage.  Pt complains of alcohol detox.  Patient states his kids brought him here as they believe he has a drinking problem.  Patient last had alcohol 5 days ago.  Patient endorsed fall with head trauma.  Patient stated at times his hands began to tremor but denied weakness.  Patient also endorsed feeling warm and having chills.  Patient denied chest pain, shortness of breath, abdominal pain, nausea/vomiting, syncope, dysuria  Review of Systems  Positive: See HPI Negative: See HPI  Physical Exam  BP 132/86   Pulse (!) 103   Temp 98.2 F (36.8 C) (Oral)   Resp 18   Ht '5\' 7"'$  (1.702 m)   Wt 81.6 kg   SpO2 100%   BMI 28.19 kg/m  Gen:   Awake, no distress   Resp:  Normal effort  MSK:   Moves extremities without difficulty  Other:  No step-offs/crepitus/abnormalities palpated on the head, eyes appeared jaundiced but pupils were PERRL, patient had 5 of 5 bilateral grip strength and sensation intact distally, 2+ bilateral radial pulses that were slightly tachycardic, patient had slowed responses with slight slur  Medical Decision Making  Medically screening exam initiated at 4:02 PM.  Appropriate orders placed.  CAYDENCE FROMM was informed that the remainder of the evaluation will be completed by another provider, this initial triage assessment does not replace that evaluation, and the importance of remaining in the ED until their evaluation is complete.  Workup initiated, patient stable at this time, CT head ordered to rule out any intracranial bleeds as patient endorsed falls   Chuck Hint, [place] 02/26/22 1606

## 2022-02-26 NOTE — ED Notes (Signed)
Patient transported to CT 

## 2022-02-27 ENCOUNTER — Observation Stay (HOSPITAL_COMMUNITY): Payer: Self-pay

## 2022-02-27 ENCOUNTER — Encounter (HOSPITAL_COMMUNITY): Payer: Self-pay | Admitting: Internal Medicine

## 2022-02-27 DIAGNOSIS — D539 Nutritional anemia, unspecified: Secondary | ICD-10-CM | POA: Diagnosis present

## 2022-02-27 DIAGNOSIS — F1010 Alcohol abuse, uncomplicated: Secondary | ICD-10-CM | POA: Diagnosis present

## 2022-02-27 DIAGNOSIS — E876 Hypokalemia: Secondary | ICD-10-CM | POA: Diagnosis present

## 2022-02-27 DIAGNOSIS — J309 Allergic rhinitis, unspecified: Secondary | ICD-10-CM | POA: Diagnosis present

## 2022-02-27 LAB — RETICULOCYTES
Immature Retic Fract: 9.5 % (ref 2.3–15.9)
RBC.: 2.7 MIL/uL — ABNORMAL LOW (ref 4.22–5.81)
Retic Count, Absolute: 93.4 10*3/uL (ref 19.0–186.0)
Retic Ct Pct: 3.5 % — ABNORMAL HIGH (ref 0.4–3.1)

## 2022-02-27 LAB — CBC WITH DIFFERENTIAL/PLATELET
Abs Immature Granulocytes: 0.02 10*3/uL (ref 0.00–0.07)
Basophils Absolute: 0 10*3/uL (ref 0.0–0.1)
Basophils Relative: 0 %
Eosinophils Absolute: 0 10*3/uL (ref 0.0–0.5)
Eosinophils Relative: 1 %
HCT: 25.7 % — ABNORMAL LOW (ref 39.0–52.0)
Hemoglobin: 8.9 g/dL — ABNORMAL LOW (ref 13.0–17.0)
Immature Granulocytes: 1 %
Lymphocytes Relative: 29 %
Lymphs Abs: 1.2 10*3/uL (ref 0.7–4.0)
MCH: 37.4 pg — ABNORMAL HIGH (ref 26.0–34.0)
MCHC: 34.6 g/dL (ref 30.0–36.0)
MCV: 108 fL — ABNORMAL HIGH (ref 80.0–100.0)
Monocytes Absolute: 0.6 10*3/uL (ref 0.1–1.0)
Monocytes Relative: 14 %
Neutro Abs: 2.2 10*3/uL (ref 1.7–7.7)
Neutrophils Relative %: 55 %
Platelets: 102 10*3/uL — ABNORMAL LOW (ref 150–400)
RBC: 2.38 MIL/uL — ABNORMAL LOW (ref 4.22–5.81)
RDW: 14.7 % (ref 11.5–15.5)
WBC: 4 10*3/uL (ref 4.0–10.5)
nRBC: 0 % (ref 0.0–0.2)

## 2022-02-27 LAB — COMPREHENSIVE METABOLIC PANEL
ALT: 16 U/L (ref 0–44)
AST: 45 U/L — ABNORMAL HIGH (ref 15–41)
Albumin: 2.9 g/dL — ABNORMAL LOW (ref 3.5–5.0)
Alkaline Phosphatase: 81 U/L (ref 38–126)
Anion gap: 13 (ref 5–15)
BUN: <5 mg/dL — ABNORMAL LOW (ref 6–20)
CO2: 20 mmol/L — ABNORMAL LOW (ref 22–32)
Calcium: 8 mg/dL — ABNORMAL LOW (ref 8.9–10.3)
Chloride: 106 mmol/L (ref 98–111)
Creatinine, Ser: 0.86 mg/dL (ref 0.61–1.24)
GFR, Estimated: >60 mL/min (ref >60)
Glucose, Bld: 122 mg/dL — ABNORMAL HIGH (ref 70–99)
Potassium: 3.3 mmol/L — ABNORMAL LOW (ref 3.5–5.1)
Sodium: 139 mmol/L (ref 135–145)
Total Bilirubin: 0.5 mg/dL (ref 0.3–1.2)
Total Protein: 5.5 g/dL — ABNORMAL LOW (ref 6.5–8.1)

## 2022-02-27 LAB — TYPE AND SCREEN
ABO/RH(D): O POS
Antibody Screen: NEGATIVE

## 2022-02-27 LAB — PHOSPHORUS: Phosphorus: 3.6 mg/dL (ref 2.5–4.6)

## 2022-02-27 LAB — APTT: aPTT: 24 seconds (ref 24–36)

## 2022-02-27 LAB — VITAMIN B12: Vitamin B-12: 554 pg/mL (ref 180–914)

## 2022-02-27 LAB — IRON AND TIBC
Iron: 55 ug/dL (ref 45–182)
Saturation Ratios: 19 % (ref 17.9–39.5)
TIBC: 288 ug/dL (ref 250–450)
UIBC: 233 ug/dL

## 2022-02-27 LAB — PROTIME-INR
INR: 1 (ref 0.8–1.2)
Prothrombin Time: 13.2 seconds (ref 11.4–15.2)

## 2022-02-27 LAB — MAGNESIUM: Magnesium: 1.4 mg/dL — ABNORMAL LOW (ref 1.7–2.4)

## 2022-02-27 LAB — FOLATE: Folate: 19.4 ng/mL (ref >5.9)

## 2022-02-27 LAB — FERRITIN: Ferritin: 74 ng/mL (ref 24–336)

## 2022-02-27 LAB — ABO/RH: ABO/RH(D): O POS

## 2022-02-27 MED ORDER — PANTOPRAZOLE SODIUM 40 MG PO TBEC
40.0000 mg | DELAYED_RELEASE_TABLET | Freq: Every day | ORAL | Status: DC
  Administered 2022-02-27 – 2022-03-01 (×3): 40 mg via ORAL
  Filled 2022-02-27 (×3): qty 1

## 2022-02-27 MED ORDER — LOPERAMIDE HCL 2 MG PO CAPS: 2.0000 mg | ORAL_CAPSULE | ORAL | Status: DC | PRN

## 2022-02-27 MED ORDER — CHLORDIAZEPOXIDE HCL 5 MG PO CAPS
25.0000 mg | ORAL_CAPSULE | Freq: Three times a day (TID) | ORAL | Status: AC
  Administered 2022-02-28 – 2022-03-01 (×3): 25 mg via ORAL
  Filled 2022-02-27 (×3): qty 5

## 2022-02-27 MED ORDER — FLUTICASONE PROPIONATE 50 MCG/ACT NA SUSP
2.0000 | Freq: Every day | NASAL | Status: DC
  Administered 2022-02-27 – 2022-03-01 (×2): 2 via NASAL
  Filled 2022-02-27 (×2): qty 16

## 2022-02-27 MED ORDER — ADULT MULTIVITAMIN W/MINERALS CH
1.0000 | ORAL_TABLET | Freq: Every day | ORAL | Status: DC
  Administered 2022-02-27: 1 via ORAL

## 2022-02-27 MED ORDER — CHLORDIAZEPOXIDE HCL 5 MG PO CAPS: 25.0000 mg | ORAL_CAPSULE | Freq: Four times a day (QID) | ORAL | Status: DC | PRN

## 2022-02-27 MED ORDER — MAGNESIUM SULFATE 2 GM/50ML IV SOLN
2.0000 g | Freq: Once | INTRAVENOUS | Status: AC
  Administered 2022-02-27: 2 g via INTRAVENOUS
  Filled 2022-02-27: qty 50

## 2022-02-27 MED ORDER — CHLORDIAZEPOXIDE HCL 5 MG PO CAPS: 25.0000 mg | ORAL_CAPSULE | ORAL | Status: DC

## 2022-02-27 MED ORDER — POTASSIUM CHLORIDE CRYS ER 20 MEQ PO TBCR
40.0000 meq | EXTENDED_RELEASE_TABLET | Freq: Once | ORAL | Status: AC
  Administered 2022-02-27: 40 meq via ORAL
  Filled 2022-02-27: qty 2

## 2022-02-27 MED ORDER — ONDANSETRON 4 MG PO TBDP: 4.0000 mg | ORAL_TABLET | Freq: Four times a day (QID) | ORAL | Status: DC | PRN

## 2022-02-27 MED ORDER — SODIUM CHLORIDE 0.9 % IV SOLN: INTRAVENOUS | Status: DC

## 2022-02-27 MED ORDER — NICOTINE 14 MG/24HR TD PT24: 14.0000 mg | MEDICATED_PATCH | Freq: Every day | TRANSDERMAL | Status: DC | PRN

## 2022-02-27 MED ORDER — CHLORDIAZEPOXIDE HCL 5 MG PO CAPS: 25.0000 mg | ORAL_CAPSULE | Freq: Every day | ORAL | Status: DC

## 2022-02-27 MED ORDER — ONDANSETRON HCL 4 MG/2ML IJ SOLN: 4.0000 mg | Freq: Four times a day (QID) | INTRAMUSCULAR | Status: DC | PRN

## 2022-02-27 MED ORDER — GABAPENTIN 100 MG PO CAPS
100.0000 mg | ORAL_CAPSULE | Freq: Three times a day (TID) | ORAL | Status: DC
  Administered 2022-02-27 – 2022-03-01 (×8): 100 mg via ORAL
  Filled 2022-02-27 (×8): qty 1

## 2022-02-27 MED ORDER — CITALOPRAM HYDROBROMIDE 10 MG PO TABS
20.0000 mg | ORAL_TABLET | Freq: Every day | ORAL | Status: DC
  Administered 2022-02-27 – 2022-03-01 (×3): 20 mg via ORAL
  Filled 2022-02-27 (×3): qty 2

## 2022-02-27 MED ORDER — LORAZEPAM 2 MG/ML IJ SOLN: 1.0000 mg | INTRAMUSCULAR | Status: DC | PRN

## 2022-02-27 MED ORDER — AMLODIPINE BESYLATE 5 MG PO TABS
5.0000 mg | ORAL_TABLET | Freq: Every day | ORAL | Status: DC
  Administered 2022-02-27 – 2022-03-01 (×3): 5 mg via ORAL
  Filled 2022-02-27 (×3): qty 1

## 2022-02-27 MED ORDER — CHLORDIAZEPOXIDE HCL 5 MG PO CAPS
25.0000 mg | ORAL_CAPSULE | Freq: Four times a day (QID) | ORAL | Status: AC
  Administered 2022-02-27 – 2022-02-28 (×6): 25 mg via ORAL
  Filled 2022-02-27 (×2): qty 5
  Filled 2022-02-27: qty 1
  Filled 2022-02-27 (×2): qty 5
  Filled 2022-02-27: qty 1

## 2022-02-27 NOTE — Progress Notes (Signed)
Patient arrived to 3W30 in NAD, VS stable and patient free from pain. Patient oriented to room and call bell in reach. Patient has cell phone, wallet, clothes and shoes at bedside.

## 2022-02-27 NOTE — Progress Notes (Signed)
PROGRESS NOTE  Jonathon Guerrero  DOB: 1970-09-19  PCP: Alvira Monday, [place]  DOA: 02/26/2022  LOS: 0 days  Hospital Day: 2  Brief narrative: Jonathon Guerrero is a 52 y.o. male with PMH significant for chronic alcohol use complicated by alcohol withdrawal seizures, hypertension, GERD, Barrett's esophagus, H. pylori gastritis 2/24, patient presented to the ED asking for detox from alcohol.  Patient apparently drinks a pint of liquor every other day, sixpack of beer daily.  Last reported drink was 3 to 4 days ago.  Since then, he has been getting shaky and nervous.  He also states he has been lately having gait imbalance. He reportedly was hospitalized at the [facility] 4 days ago for alcohol withdrawal seizures, but he left AMA.  He felt withdrawal symptoms coming back again and hence came to the ED.  In the ED, is afebrile, heart rate 103, blood pressure 132/86, breathing on room air. Initial labs showed potassium low at 2.7, hemoglobin 10 with MCV 110, ammonia level elevated to 39 Blood alcohol level 39. CT head did not show any evidence of acute intracranial process. Patient was given potassium replacement, IV hydration Admitted to [facility].  Subjective: Patient was seen and examined this morning.  Middle-aged African-American male.  Lying on bed.  Mild alcohol tremors noted.  Not in distress.  He says he wants to quit for real this time. Lives at a trailer.  Kids are involved in his care but do not live together. No family at bedside this morning. Remains afebrile.  Blood pressure elevated to 170s this morning. Labs from this morning with potassium at 3.3, hemoglobin 8.9  Assessment and plan: Acute alcohol withdrawal symptoms Chronic alcohol abuse Presented to the ED asking for detox Apparently was hospitalized at an outside hospital 4 days ago for alcohol withdrawal seizures and left AMA.  It seems patient drank after that as well.  Blood alcohol level was  elevated to 39 at presentation. He is at risk of alcohol withdrawal seizures at this time. Currently he is on CIWA protocol with as needed IV Ativan.  I would add scheduled Librium taper as well. Counseled to quit alcohol. TOC to provide resources. Obtain liver ultrasound to rule out liver cirrhosis.  Impaired gait Reports gait imbalance lately when he tries to get up and walk.  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  Will obtain folate and vitamin B12 level. Obtain PT eval.  Start Neurontin 100 mg 3 times daily for now. Recent Labs    02/26/22 1602 02/27/22 0226  MCV 110.2* 108.0*    Hypokalemia/hypomagnesemia Labs this morning with low potassium and magnesium level. Replacement given.  Recheck tomorrow. Recent Labs  Lab 02/26/22 1602 02/27/22 0226  K 2.7* 3.3*  MG  --  1.4*  PHOS  --  3.6   Chronic macrocytic anemia Likely related to alcohol.  Hemoglobin trend as below.  No active bleeding at this time.  Continue to monitor. Recent Labs    02/26/22 1602 02/27/22 0226  HGB 10.0* 8.9*  MCV 110.2* 108.0*   GERD/reflux esophagitis Barrett's esophagus Continue Protonix.   Essential hypertension PTA on amlodipine 5 mg daily, lisinopril 10 mg daily Continue amlodipine. Lisinopril on hold.  Allergic rhinitis As needed Flonase  Anxiety/depression Celexa 20 mg daily    Chronic tobacco abuse Counseled to quit.  Mobility: PT eval ordered  Goals of care   Code Status: Full Code     DVT prophylaxis:  SCDs Start:  02/26/22 2235   Antimicrobials: None Fluid: Start NS at 125 mill per hour Consultants: None Family Communication: None at bedside  Status is: Observation Level of care: Telemetry Medical   Dispo: Patient is from: Home              Anticipated d/c is to: Home in 1 to 2 days Continue in-hospital care because: Alcohol intoxication, wants detox.   Scheduled Meds:  amLODipine  5 mg Oral Daily   chlordiazePOXIDE  25 mg  Oral QID   Followed by   Derrill Memo ON 02/28/2022] chlordiazePOXIDE  25 mg Oral TID   Followed by   Derrill Memo ON 03/01/2022] chlordiazePOXIDE  25 mg Oral BH-qamhs   Followed by   Derrill Memo ON 03/02/2022] chlordiazePOXIDE  25 mg Oral Daily   citalopram  20 mg Oral Daily   fluticasone  2 spray Each Nare Daily   folic acid  1 mg Oral Daily   gabapentin  100 mg Oral TID   multivitamin with minerals  1 tablet Oral Daily   pantoprazole  40 mg Oral Daily   thiamine  100 mg Oral Daily   Or   thiamine  100 mg Intravenous Daily    PRN meds: acetaminophen **OR** acetaminophen, chlordiazePOXIDE, loperamide, LORazepam, LORazepam **OR** LORazepam, melatonin, nicotine, ondansetron (ZOFRAN) IV, ondansetron   Infusions:   sodium chloride      Diet:  Diet Order             Diet regular Room service appropriate? Yes; Fluid consistency: Thin  Diet effective now                   Antimicrobials: Anti-infectives (From admission, onward)    None       Skin assessment:       Nutritional status:  Body mass index is 28.19 kg/m.          Objective: Vitals:   02/27/22 0800 02/27/22 0855  BP: (!) 179/135 (!) 143/110  Pulse: 87 86  Resp: 15 16  Temp:  98.2 F (36.8 C)  SpO2: 99% 100%   No intake or output data in the 24 hours ending 02/27/22 1021 Filed Weights   02/26/22 1553  Weight: 81.6 kg   Weight change:  Body mass index is 28.19 kg/m.   Physical Exam: General exam: Pleasant, middle-aged African-American male.  Poor hygiene noted.  Foul-smelling room Skin: No rashes, lesions or ulcers. HEENT: Atraumatic, normocephalic, no obvious bleeding Lungs: Clear to auscultation bilaterally CVS: Regular rate and rhythm, no murmur GI/Abd soft, nontender, nondistended, bowel sound present CNS: Alert, awake, oriented x 3.  Mild alcohol related tremors noted Psychiatry: Sad affect Extremities: No pedal edema, no calf tenderness  Data Review: I have personally reviewed the  laboratory data and studies available.  F/u labs ordered Unresulted Labs (From admission, onward)     Start     Ordered   02/28/22 [id number]  Basic metabolic panel  Tomorrow morning,   R        02/27/22 0851   02/28/22 0500  CBC with Differential/Platelet  Tomorrow morning,   R        02/27/22 0851   02/27/22 0725  ABO/Rh  Once,   R        02/27/22 0725   02/27/22 0549  Ferritin  Add-on,   AD        02/27/22 0548   02/27/22 0549  Iron and TIBC  Add-on,   AD  02/27/22 0548   02/27/22 0549  Vitamin B12  Add-on,   AD        02/27/22 0548   02/27/22 0549  Reticulocytes  Add-on,   AD        02/27/22 0548   02/27/22 0549  Folate  Add-on,   AD        02/27/22 0548   02/27/22 0546  Protime-INR  Add-on,   AD        02/27/22 0546   02/27/22 0546  APTT  Add-on,   AD        02/27/22 0546            Total time spent in review of labs and imaging, patient evaluation, formulation of plan, documentation and communication with family: 5 minutes  Signed, Terrilee Croak, MD [facility] 02/27/2022

## 2022-02-27 NOTE — Care Management (Addendum)
Placed  SA outpatient services / numbers in patients area  on discharge instructions

## 2022-02-27 NOTE — ED Notes (Signed)
ED TO INPATIENT HANDOFF REPORT  ED Nurse Name and Phone #: Duanne Guess [phone]  S Name/Age/Gender Jonathon Guerrero 52 y.o. male Room/Bed: 038C/038C  Code Status   Code Status: Full Code  Home/SNF/Other Home Patient oriented to: self, place, time, and situation Is this baseline? Yes   Triage Complete: Triage complete  Chief Complaint Alcohol withdrawal ([place]) [F10.939]  Triage Note Pt requesting detox from alcohol. Pt states he drinks pint of liquor every other day. Last drink 5 days ago.    Allergies Allergies  Allergen Reactions   Sardine Flavor    Poison Sumac Extract Itching, Swelling and Rash    Level of Care/Admitting Diagnosis ED Disposition     ED Disposition  Admit   Condition  --   Comment  Hospital Area: [place] [[id number]]  Level of Care: Telemetry Medical [104]  May place patient in observation at [facility] or [place] if equivalent level of care is available:: No  Covid Evaluation: Asymptomatic - no recent exposure (last 10 days) testing not required  Diagnosis: Alcohol withdrawal ([place]) [291.81.ICD-9-CM]  Admitting Physician: Rhetta Mura [id number]  Attending Physician: Rhetta Mura [id number]          B Medical/Surgery History Past Medical History:  Diagnosis Date   Barrett's esophagus without dysplasia    GERD (gastroesophageal reflux disease)    Helicobacter pylori gastritis 09/2010   completed Prevpac   Hypertension    Reflux esophagitis    severe erosive RE on EGD 09/2010   Seizures [facility])    Past Surgical History:  Procedure Laterality Date   COLONOSCOPY  09/2010   hemorrhoids, small hepatic flexure polyp (ablated)   ESOPHAGOGASTRODUODENOSCOPY  09/2010   severe erosive RE, Barrett's, h.pylori gastritis   POLYPECTOMY  09/09/2010   Dr Gala Romney     A IV Location/Drains/Wounds Patient Lines/Drains/Airways Status     Active Line/Drains/Airways     Name Placement date Placement time Site Days    Peripheral IV 02/27/22 20 G Anterior;Left Forearm 02/27/22  0025  Forearm  less than 1            Intake/Output Last 24 hours No intake or output data in the 24 hours ending 02/27/22 1357  Labs/Imaging Results for orders placed or performed during the hospital encounter of 02/26/22 (from the past 48 hour(s))  Ethanol     Status: Abnormal   Collection Time: 02/26/22  4:02 PM  Result Value Ref Range   Alcohol, Ethyl (B) 39 (H) <10 mg/dL    Comment: (NOTE) Lowest detectable limit for serum alcohol is 10 mg/dL.  For medical purposes only. Performed at [facility], [place] [address], [place], [place] [address]   Comprehensive metabolic panel     Status: Abnormal   Collection Time: 02/26/22  4:02 PM  Result Value Ref Range   Sodium 138 135 - 145 mmol/L   Potassium 2.7 (LL) 3.5 - 5.1 mmol/L    Comment: CRITICAL RESULT CALLED TO, READ BACK BY AND VERIFIED WITH K NOVIELLO RN AT 1724 02/26/22 BY D LONG   Chloride 103 98 - 111 mmol/L   CO2 24 22 - 32 mmol/L   Glucose, Bld 87 70 - 99 mg/dL    Comment: Glucose reference range applies only to samples taken after fasting for at least 8 hours.   BUN <5 (L) 6 - 20 mg/dL   Creatinine, Ser 0.81 0.61 - 1.24 mg/dL   Calcium 8.5 (L) 8.9 - 10.3 mg/dL  Total Protein 6.6 6.5 - 8.1 g/dL   Albumin 3.6 3.5 - 5.0 g/dL   AST 52 (H) 15 - 41 U/L   ALT 18 0 - 44 U/L   Alkaline Phosphatase 92 38 - 126 U/L   Total Bilirubin 1.0 0.3 - 1.2 mg/dL   GFR, Estimated >60 >60 mL/min    Comment: (NOTE) Calculated using the CKD-EPI Creatinine Equation (2021)    Anion gap 11 5 - 15    Comment: Performed at [facility] [address] [place], [place] [address]  CBC with Differential     Status: Abnormal   Collection Time: 02/26/22  4:02 PM  Result Value Ref Range   WBC 4.8 4.0 - 10.5 K/uL   RBC 2.74 (L) 4.22 - 5.81 MIL/uL   Hemoglobin 10.0 (L) 13.0 - 17.0 g/dL   HCT 30.2 (L) 39.0 - 52.0 %   MCV 110.2 (H) 80.0 - 100.0 fL   MCH 36.5 (H) 26.0 -  34.0 pg   MCHC 33.1 30.0 - 36.0 g/dL   RDW 14.5 11.5 - 15.5 %   Platelets 103 (L) 150 - 400 K/uL   nRBC 0.0 0.0 - 0.2 %   Neutrophils Relative % 60 %   Neutro Abs 3.0 1.7 - 7.7 K/uL   Lymphocytes Relative 25 %   Lymphs Abs 1.2 0.7 - 4.0 K/uL   Monocytes Relative 12 %   Monocytes Absolute 0.6 0.1 - 1.0 K/uL   Eosinophils Relative 1 %   Eosinophils Absolute 0.0 0.0 - 0.5 K/uL   Basophils Relative 1 %   Basophils Absolute 0.0 0.0 - 0.1 K/uL   Immature Granulocytes 1 %   Abs Immature Granulocytes 0.03 0.00 - 0.07 K/uL    Comment: Performed at [facility], [address] [place], [place] [address]  Resp panel by RT-PCR (RSV, Flu A&B, Covid) Anterior Nasal Swab     Status: None   Collection Time: 02/26/22  4:03 PM   Specimen: Anterior Nasal Swab  Result Value Ref Range   SARS Coronavirus 2 by RT PCR NEGATIVE NEGATIVE   Influenza A by PCR NEGATIVE NEGATIVE   Influenza B by PCR NEGATIVE NEGATIVE    Comment: (NOTE) The Xpert Xpress SARS-CoV-2/FLU/RSV plus assay is intended as an aid in the diagnosis of influenza from Nasopharyngeal swab specimens and should not be used as a sole basis for treatment. Nasal washings and aspirates are unacceptable for Xpert Xpress SARS-CoV-2/FLU/RSV testing.  Fact Sheet for Patients: EntrepreneurPulse.com.au  Fact Sheet for Healthcare Providers: IncredibleEmployment.be  This test is not yet approved or cleared by the [place] FDA and has been authorized for detection and/or diagnosis of SARS-CoV-2 by FDA under an Emergency Use Authorization (EUA). This EUA will remain in effect (meaning this test can be used) for the duration of the COVID-19 declaration under Section 564(b)(1) of the Act, 21 U.S.C. section 360bbb-3(b)(1), unless the authorization is terminated or revoked.     Resp Syncytial Virus by PCR NEGATIVE NEGATIVE    Comment: (NOTE) Fact Sheet for  Patients: EntrepreneurPulse.com.au  Fact Sheet for Healthcare Providers: IncredibleEmployment.be  This test is not yet approved or cleared by the [place] FDA and has been authorized for detection and/or diagnosis of SARS-CoV-2 by FDA under an Emergency Use Authorization (EUA). This EUA will remain in effect (meaning this test can be used) for the duration of the COVID-19 declaration under Section 564(b)(1) of the Act, 21 U.S.C. section 360bbb-3(b)(1), unless the authorization is terminated  or revoked.  Performed at [facility], [place] [address], [place], [place] [address]   Ammonia     Status: Abnormal   Collection Time: 02/26/22  6:11 PM  Result Value Ref Range   Ammonia 39 (H) 9 - 35 umol/L    Comment: Performed at [facility], [place] [address], [place], [place] [address]  CBC with Differential/Platelet     Status: Abnormal   Collection Time: 02/27/22  2:26 AM  Result Value Ref Range   WBC 4.0 4.0 - 10.5 K/uL   RBC 2.38 (L) 4.22 - 5.81 MIL/uL   Hemoglobin 8.9 (L) 13.0 - 17.0 g/dL   HCT 25.7 (L) 39.0 - 52.0 %   MCV 108.0 (H) 80.0 - 100.0 fL   MCH 37.4 (H) 26.0 - 34.0 pg   MCHC 34.6 30.0 - 36.0 g/dL   RDW 14.7 11.5 - 15.5 %   Platelets 102 (L) 150 - 400 K/uL   nRBC 0.0 0.0 - 0.2 %   Neutrophils Relative % 55 %   Neutro Abs 2.2 1.7 - 7.7 K/uL   Lymphocytes Relative 29 %   Lymphs Abs 1.2 0.7 - 4.0 K/uL   Monocytes Relative 14 %   Monocytes Absolute 0.6 0.1 - 1.0 K/uL   Eosinophils Relative 1 %   Eosinophils Absolute 0.0 0.0 - 0.5 K/uL   Basophils Relative 0 %   Basophils Absolute 0.0 0.0 - 0.1 K/uL   Immature Granulocytes 1 %   Abs Immature Granulocytes 0.02 0.00 - 0.07 K/uL    Comment: Performed at [facility], [place] [address], [place], [place] [address]  Comprehensive metabolic panel     Status: Abnormal   Collection Time: 02/27/22  2:26 AM  Result Value Ref Range   Sodium 139 135 - 145 mmol/L   Potassium 3.3  (L) 3.5 - 5.1 mmol/L   Chloride 106 98 - 111 mmol/L   CO2 20 (L) 22 - 32 mmol/L   Glucose, Bld 122 (H) 70 - 99 mg/dL    Comment: Glucose reference range applies only to samples taken after fasting for at least 8 hours.   BUN <5 (L) 6 - 20 mg/dL   Creatinine, Ser 0.86 0.61 - 1.24 mg/dL   Calcium 8.0 (L) 8.9 - 10.3 mg/dL   Total Protein 5.5 (L) 6.5 - 8.1 g/dL   Albumin 2.9 (L) 3.5 - 5.0 g/dL   AST 45 (H) 15 - 41 U/L   ALT 16 0 - 44 U/L   Alkaline Phosphatase 81 38 - 126 U/L   Total Bilirubin 0.5 0.3 - 1.2 mg/dL   GFR, Estimated >60 >60 mL/min    Comment: (NOTE) Calculated using the CKD-EPI Creatinine Equation (2021)    Anion gap 13 5 - 15    Comment: Performed at [facility], [place] [address], [place], [place] [address]  Magnesium     Status: Abnormal   Collection Time: 02/27/22  2:26 AM  Result Value Ref Range   Magnesium 1.4 (L) 1.7 - 2.4 mg/dL    Comment: Performed at [facility] [address], [place], [place] [address]  Phosphorus     Status: None   Collection Time: 02/27/22  2:26 AM  Result Value Ref Range   Phosphorus 3.6 2.5 - 4.6 mg/dL    Comment: Performed at [facility] [address], [place], [place] [address]  Type and screen [place]  Status: None   Collection Time: 02/27/22  6:03 AM  Result Value Ref Range   ABO/RH(D) O POS    Antibody Screen NEG    Sample Expiration      03/02/2022,2359 Performed at [facility], [place] [address] [place], [place] [address]    US Abdomen Limited RUQ (LIVER/GB)  Result Date: 02/27/2022 CLINICAL DATA:  Alcohol abuse. EXAM: ULTRASOUND ABDOMEN LIMITED RIGHT UPPER QUADRANT COMPARISON:  CT abdomen dated 11/21/2016 FINDINGS: Gallbladder: Gallbladder is contracted but otherwise unremarkable. No gallstones seen. No evidence of gallbladder wall thickening. No sonographic Murphy's sign elicited during the exam. Common bile duct: Diameter: 3 mm Liver: Liver is diffusely echogenic suggesting  fatty infiltration. Hypoechoic mass is seen within the RIGHT liver lobe, measuring 1.7 x 1.4 x 1.5 cm. Portal vein is patent on color Doppler imaging with normal direction of blood flow towards the liver. Other: None. IMPRESSION: 1. Hypoechoic mass within the RIGHT liver lobe, measuring 1.7 cm. This finding is indeterminate but hepatocellular carcinoma is not excluded. Recommend nonemergent liver MRI further characterization. 2. Fatty infiltration of the liver. 3. Gallbladder is contracted but otherwise unremarkable. No gallstones. No evidence of cholecystitis. No bile duct dilatation. Electronically Signed   By: Franki Cabot M.D.   On: 02/27/2022 11:14   CT Head Wo Contrast  Result Date: 02/26/2022 CLINICAL DATA:  52 year old male with recent falls.  Alcohol abuse. EXAM: CT HEAD WITHOUT CONTRAST TECHNIQUE: Contiguous axial images were obtained from the base of the skull through the vertex without intravenous contrast. RADIATION DOSE REDUCTION: This exam was performed according to the departmental dose-optimization program which includes automated exposure control, adjustment of the mA and/or kV according to patient size and/or use of iterative reconstruction technique. COMPARISON:  09/19/2021 CT and prior studies FINDINGS: Brain: No evidence of acute infarction, hemorrhage, hydrocephalus, extra-axial collection or mass lesion/mass effect. Atrophy and chronic small-vessel white matter ischemic changes again noted. Vascular: No hyperdense vessel or unexpected calcification. Skull: Normal. Negative for fracture or focal lesion. Sinuses/Orbits: No acute finding. Other: None. IMPRESSION: 1. No evidence of acute intracranial abnormality. 2. Atrophy and chronic small-vessel white matter ischemic changes. Electronically Signed   By: Margarette Canada M.D.   On: 02/26/2022 18:41    Pending Labs Unresulted Labs (From admission, onward)     Start     Ordered   02/28/22 [id number]  Basic metabolic panel  Tomorrow morning,   R         02/27/22 0851   02/28/22 0500  CBC with Differential/Platelet  Tomorrow morning,   R        02/27/22 0851   02/27/22 0725  ABO/Rh  Once,   R        02/27/22 0725   02/27/22 0549  Ferritin  Add-on,   AD        02/27/22 0548   02/27/22 0549  Iron and TIBC  Add-on,   AD        02/27/22 0548   02/27/22 0549  Vitamin B12  Add-on,   AD        02/27/22 0548   02/27/22 0549  Reticulocytes  Add-on,   AD        02/27/22 0548   02/27/22 0549  Folate  Add-on,   AD        02/27/22 0548   02/27/22 0546  Protime-INR  Add-on,   AD        02/27/22 0546   02/27/22 0546  APTT  Add-on,  AD        02/27/22 0546            Vitals/Pain Today's Vitals   02/27/22 0855 02/27/22 1228 02/27/22 1230 02/27/22 1300  BP: (!) 143/110  (!) 151/95 (!) 134/90  Pulse: 86  69 69  Resp: 16  12   Temp: 98.2 F (36.8 C) 98.1 F (36.7 C)    TempSrc: Oral Oral    SpO2: 100%  100%   Weight:      Height:      PainSc: 0-No pain       Isolation Precautions No active isolations  Medications Medications  acetaminophen (TYLENOL) tablet 650 mg (has no administration in time range)    Or  acetaminophen (TYLENOL) suppository 650 mg (has no administration in time range)  melatonin tablet 3 mg (has no administration in time range)  LORazepam (ATIVAN) tablet 1-4 mg ( Oral See Alternative 02/27/22 0500)    Or  LORazepam (ATIVAN) injection 1-4 mg (1 mg Intravenous Given [id number])  folic acid (FOLVITE) tablet 1 mg (1 mg Oral Given 02/27/22 0900)  multivitamin with minerals tablet 1 tablet (1 tablet Oral Not Given 02/27/22 0900)  thiamine (VITAMIN B1) tablet 100 mg (100 mg Oral Given 02/27/22 0900)    Or  thiamine (VITAMIN B1) injection 100 mg ( Intravenous See Alternative 02/27/22 0900)  LORazepam (ATIVAN) injection 1 mg (has no administration in time range)  amLODipine (NORVASC) tablet 5 mg (5 mg Oral Given 02/27/22 0900)  citalopram (CELEXA) tablet 20 mg (20 mg Oral Given 02/27/22 0900)  fluticasone  (FLONASE) 50 MCG/ACT nasal spray 2 spray (2 sprays Each Nare Given 02/27/22 0900)  pantoprazole (PROTONIX) EC tablet 40 mg (40 mg Oral Given 02/27/22 0900)  ondansetron (ZOFRAN) injection 4 mg (has no administration in time range)  nicotine (NICODERM CQ - dosed in mg/24 hours) patch 14 mg (has no administration in time range)  chlordiazePOXIDE (LIBRIUM) capsule 25 mg (has no administration in time range)  loperamide (IMODIUM) capsule 2-4 mg (has no administration in time range)  ondansetron (ZOFRAN-ODT) disintegrating tablet 4 mg (has no administration in time range)  chlordiazePOXIDE (LIBRIUM) capsule 25 mg (25 mg Oral Given 02/27/22 1338)    Followed by  chlordiazePOXIDE (LIBRIUM) capsule 25 mg (has no administration in time range)    Followed by  chlordiazePOXIDE (LIBRIUM) capsule 25 mg (has no administration in time range)    Followed by  chlordiazePOXIDE (LIBRIUM) capsule 25 mg (has no administration in time range)  gabapentin (NEURONTIN) capsule 100 mg (100 mg Oral Given 02/27/22 1038)  0.9 %  sodium chloride infusion ( Intravenous New Bag/Given 02/27/22 1039)  potassium chloride 10 mEq in 100 mL IVPB (0 mEq Intravenous Stopped 02/26/22 2256)  thiamine (VITAMIN B1) injection 100 mg (100 mg Intravenous Given 02/27/22 0030)  potassium chloride SA (KLOR-CON M) CR tablet 40 mEq (40 mEq Oral Given 02/27/22 0028)  potassium chloride SA (KLOR-CON M) CR tablet 40 mEq (40 mEq Oral Given 02/27/22 0542)  magnesium sulfate IVPB 2 g 50 mL (0 g Intravenous Stopped 02/27/22 0701)    Mobility walks with person assist- weakness in legs     Focused Assessments EtOH abuse/rehab   R Recommendations: See Admitting Provider Note  Report given to:   Additional Notes:

## 2022-02-27 NOTE — Discharge Instructions (Addendum)
[address], [place], [place]  [place]  [address] [place], [place]  [facility] [address]  [place], [place]   [facility] [address] [place], [place]

## 2022-02-27 NOTE — ED Notes (Signed)
This RN assumed care of patient, transfer of care report received from previous night shift RN. Pt presented to ED for EtOH withdrawal/detox by family. Pt amicable with treatment. PT is A&Ox4, NAD noted at this time. Pt is resting on gurney at this time, respirations are spontaneous, even, unlabored and symmetrical bilaterally. Pt skin tone is appropriate for ethnicity, dry and warm. Pt connected to CCM, pulse ox and BP. Support person at bedside.

## 2022-02-27 NOTE — ED Notes (Signed)
Pt up OOB, disconnected from equipment. Pt states he forgot where he was. Pt redirected to the bed, equipment placed back on pt, and CIWA scale obtained.

## 2022-02-27 NOTE — ED Notes (Signed)
PT well appearing upon transfer to floor.

## 2022-02-28 ENCOUNTER — Inpatient Hospital Stay (HOSPITAL_COMMUNITY): Payer: Self-pay

## 2022-02-28 ENCOUNTER — Telehealth: Payer: Self-pay | Admitting: Family Medicine

## 2022-02-28 LAB — CBC WITH DIFFERENTIAL/PLATELET
Abs Immature Granulocytes: 0.01 10*3/uL (ref 0.00–0.07)
Basophils Absolute: 0 10*3/uL (ref 0.0–0.1)
Basophils Relative: 0 %
Eosinophils Absolute: 0.1 10*3/uL (ref 0.0–0.5)
Eosinophils Relative: 2 %
HCT: 26.7 % — ABNORMAL LOW (ref 39.0–52.0)
Hemoglobin: 9.3 g/dL — ABNORMAL LOW (ref 13.0–17.0)
Immature Granulocytes: 0 %
Lymphocytes Relative: 27 %
Lymphs Abs: 1.2 10*3/uL (ref 0.7–4.0)
MCH: 37.2 pg — ABNORMAL HIGH (ref 26.0–34.0)
MCHC: 34.8 g/dL (ref 30.0–36.0)
MCV: 106.8 fL — ABNORMAL HIGH (ref 80.0–100.0)
Monocytes Absolute: 0.7 10*3/uL (ref 0.1–1.0)
Monocytes Relative: 15 %
Neutro Abs: 2.5 10*3/uL (ref 1.7–7.7)
Neutrophils Relative %: 56 %
Platelets: 134 10*3/uL — ABNORMAL LOW (ref 150–400)
RBC: 2.5 MIL/uL — ABNORMAL LOW (ref 4.22–5.81)
RDW: 14.4 % (ref 11.5–15.5)
WBC: 4.5 10*3/uL (ref 4.0–10.5)
nRBC: 0 % (ref 0.0–0.2)

## 2022-02-28 LAB — BASIC METABOLIC PANEL
Anion gap: 8 (ref 5–15)
BUN: <5 mg/dL — ABNORMAL LOW (ref 6–20)
CO2: 24 mmol/L (ref 22–32)
Calcium: 8.1 mg/dL — ABNORMAL LOW (ref 8.9–10.3)
Chloride: 103 mmol/L (ref 98–111)
Creatinine, Ser: 0.79 mg/dL (ref 0.61–1.24)
GFR, Estimated: >60 mL/min (ref >60)
Glucose, Bld: 93 mg/dL (ref 70–99)
Potassium: 3.4 mmol/L — ABNORMAL LOW (ref 3.5–5.1)
Sodium: 135 mmol/L (ref 135–145)

## 2022-02-28 MED ORDER — POTASSIUM CHLORIDE CRYS ER 20 MEQ PO TBCR
40.0000 meq | EXTENDED_RELEASE_TABLET | Freq: Once | ORAL | Status: AC
  Administered 2022-02-28: 40 meq via ORAL
  Filled 2022-02-28: qty 2

## 2022-02-28 NOTE — TOC Initial Note (Addendum)
Transition of Care [facility]) - Initial/Assessment Note    Patient Details  Name: Jonathon Guerrero MRN: [id number] Date of Birth: 1970/11/12  Transition of Care [facility]) CM/SW Contact:    Pollie Friar, RN Phone Number: 02/28/2022, 1:35 PM  Clinical Narrative:                 CM met with the patient at the bedside. He states he lives alone and the only person to check on him is his brother that lives about 20 min away. No DME. Pt drives self as needed.  Pt manages his own medications and states he should be on potassium and sz medicine but he ran out.  PCP listed pt says is new and he hasn't seen them yet. CM will call and see if they have him in their system. Pt lives in [place].  He is interested in getting into an inpt rehab for alcohol abuse when medically ready. CM awaiting PT/OT to see their recommendations as he will need to be IADL for alcohol rehab.  TOC following.  [id number]: PCP: Alvira Monday, NP-pt was seen in June and missed appt in September. They still consider him active. Pt will need to ask for financial aide forms when he does his f/u appt.   Expected Discharge Plan:  (inpatient alcohol rehab) Barriers to Discharge: Continued Medical Work up   Patient Goals and CMS Choice            Expected Discharge Plan and Services   Discharge Planning Services: CM Consult   Living arrangements for the past 2 months: Single Family Home                                      Prior Living Arrangements/Services Living arrangements for the past 2 months: Single Family Home Lives with:: Self Patient language and need for interpreter reviewed:: Yes Do you feel safe going back to the place where you live?: Yes        Care giver support system in place?: No (comment)   Criminal Activity/Legal Involvement Pertinent to Current Situation/Hospitalization: No - Comment as needed  Activities of Daily Living Home Assistive Devices/Equipment: None ADL Screening (condition at time  of admission) Patient's cognitive ability adequate to safely complete daily activities?: Yes Is the patient deaf or have difficulty hearing?: No Does the patient have difficulty seeing, even when wearing glasses/contacts?: No Does the patient have difficulty concentrating, remembering, or making decisions?: No Patient able to express need for assistance with ADLs?: Yes Does the patient have difficulty dressing or bathing?: No Independently performs ADLs?: Yes (appropriate for developmental age) Does the patient have difficulty walking or climbing stairs?: No Weakness of Legs: Both Weakness of Arms/Hands: None  Permission Sought/Granted                  Emotional Assessment Appearance:: Appears stated age Attitude/Demeanor/Rapport: Engaged Affect (typically observed): Accepting Orientation: : Oriented to Self, Oriented to Place, Oriented to Situation Alcohol / Substance Use: Alcohol Use Psych Involvement: No (comment)  Admission diagnosis:  Alcohol withdrawal ([place]) [F10.939] Alcohol abuse [F10.10] Patient Active Problem List   Diagnosis Date Noted   Chronic alcohol abuse 02/27/2022   Hypokalemia 02/27/2022   Hypomagnesemia 02/27/2022   Macrocytic anemia 02/27/2022   Allergic rhinitis 02/27/2022   Alcohol withdrawal ([place]) 02/26/2022   Epilepsy ([place]) 06/29/2021   Tobacco abuse 06/29/2021   Essential hypertension 06/29/2021  Depression 06/29/2021   Barrett's esophagus without dysplasia 09/22/2010   Reflux esophagitis 09/22/2010   GERD (gastroesophageal reflux disease) 09/07/2010   PCP:  Alvira Monday, FNP Pharmacy:   [place], [place] [place] [address] Phone: [phone] Fax: [place] [address], [place] [address] Phone: [phone] Fax: [place], [place] [id number] [place] [id number] Phone:  [phone] Fax: [phone]     Social Determinants of Health (SDOH) Social History: [place]: Food Insecurity Present (02/27/2022)  Housing: Medium Risk (02/27/2022)  Transportation Needs: No Transportation Needs (02/27/2022)  Utilities: Not At Risk (02/27/2022)  Depression (PHQ2-9): Low Risk  (06/29/2021)  Tobacco Use: High Risk (02/27/2022)   SDOH Interventions:     Readmission Risk Interventions     No data to display

## 2022-02-28 NOTE — Progress Notes (Signed)
Patient would not cooperate for exam.  After instructions, patient kept going to sleep and turning on side and moving imaging coil.

## 2022-02-28 NOTE — Progress Notes (Signed)
PROGRESS NOTE  Jonathon Guerrero  DOB: 01-05-1970  PCP: Alvira Monday, [place]  DOA: 02/26/2022  LOS: 1 day  Hospital Day: 3  Brief narrative: Jonathon Guerrero is a 52 y.o. male with PMH significant for chronic alcohol use complicated by alcohol withdrawal seizures, hypertension, GERD, Barrett's esophagus, H. pylori gastritis 2/24, patient presented to the ED asking for detox from alcohol.  Patient apparently drinks a pint of liquor every other day, sixpack of beer daily.  Last reported drink was 3 to 4 days ago.  Since then, he has been getting shaky and nervous.  He also states he has been lately having gait imbalance. He reportedly was hospitalized at the [facility] 4 days ago for alcohol withdrawal seizures, but he left AMA.  He felt withdrawal symptoms coming back again and hence came to the ED.  In the ED, is afebrile, heart rate 103, blood pressure 132/86, breathing on room air. Initial labs showed potassium low at 2.7, hemoglobin 10 with MCV 110, ammonia level elevated to 39 Blood alcohol level 39. CT head did not show any evidence of acute intracranial process. Patient was given potassium replacement, IV hydration Admitted to [facility].  Subjective: Patient was seen and examined this morning.   Lying on bed.  Not in distress.  Mild tremors noted.  Feels better than at presentation.  Hemodynamically improving. Pending PT eval. Remains on Librium detox protocol.  Assessment and plan: Acute alcohol withdrawal symptoms Chronic alcohol abuse Presented to the ED asking for detox Apparently was hospitalized at an outside hospital 4 days ago for alcohol withdrawal seizures and left AMA.  It seems patient drank after that as well.  Blood alcohol level was elevated to 39 at presentation. He was started on detox protocol with scheduled Librium taper and as needed IV Ativan.  Has mild tremors.  Anxiety seems better than yesterday.  Counseled to quit alcohol. TOC to  provide resources.  Fatty liver Liver mass 2/25, ultrasound right upper quadrant showed fatty infiltration of the liver as well as hypoechoic 1.7 cm mass in the right lobe of liver.  Obtain MRI liver for further characterization and rule out [facility]  Impaired gait Reports gait imbalance lately when he tries to get up and walk.  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  Vitamin B12 and folate levels normal as below. Pending PT eval. He has been started on Neurontin 100 mg 3 times daily for now. Recent Labs    02/27/22 0226 02/27/22 1601 02/28/22 0426  MCV 108.0*  --  106.8*  VITAMINB12  --  554  --   FOLATE  --  19.4  --     Hypokalemia/hypomagnesemia Potassium level remains low this morning.  Replacement ordered. Recent Labs  Lab 02/26/22 1602 02/27/22 0226 02/28/22 0426  K 2.7* 3.3* 3.4*  MG  --  1.4*  --   PHOS  --  3.6  --     Chronic macrocytic anemia Likely related to alcohol.  Hemoglobin trend as below.  No active bleeding at this time.  Continue to monitor. Recent Labs    02/26/22 1602 02/27/22 0226 02/27/22 1535 02/27/22 1601 02/28/22 0426  HGB 10.0* 8.9*  --   --  9.3*  MCV 110.2* 108.0*  --   --  106.8*  VITAMINB12  --   --   --  554  --   FOLATE  --   --   --  19.4  --   FERRITIN  --   --  74  --   --   TIBC  --   --  288  --   --   IRON  --   --  55  --   --   RETICCTPCT  --   --  3.5*  --   --     GERD/reflux esophagitis Barrett's esophagus Continue Protonix.   Essential hypertension PTA on amlodipine 5 mg daily, lisinopril 10 mg daily Continue amlodipine.  Lisinopril on hold.  Allergic rhinitis As needed Flonase  Anxiety/depression Celexa 20 mg daily    Chronic tobacco abuse Counseled to quit.  Mobility: PT eval ordered  Goals of care   Code Status: Full Code     DVT prophylaxis:  SCDs Start: 02/26/22 2235   Antimicrobials: None Fluid: Continue NS at 125 mill per hour Consultants: None Family  Communication: None at bedside  Status is: Inpatient Level of care: Telemetry Medical   Dispo: Patient is from: Home              Anticipated d/c is to: Home in 1 to 2 days Continue in-hospital care because: Alcohol detox ongoing.  Pending MRI liver, pending PT eval   Scheduled Meds:  amLODipine  5 mg Oral Daily   chlordiazePOXIDE  25 mg Oral QID   Followed by   chlordiazePOXIDE  25 mg Oral TID   Followed by   Derrill Memo ON 03/01/2022] chlordiazePOXIDE  25 mg Oral BH-qamhs   Followed by   Derrill Memo ON 03/02/2022] chlordiazePOXIDE  25 mg Oral Daily   citalopram  20 mg Oral Daily   fluticasone  2 spray Each Nare Daily   folic acid  1 mg Oral Daily   gabapentin  100 mg Oral TID   multivitamin with minerals  1 tablet Oral Daily   pantoprazole  40 mg Oral Daily   thiamine  100 mg Oral Daily   Or   thiamine  100 mg Intravenous Daily    PRN meds: acetaminophen **OR** acetaminophen, chlordiazePOXIDE, loperamide, LORazepam, LORazepam **OR** LORazepam, melatonin, nicotine, ondansetron (ZOFRAN) IV, ondansetron   Infusions:   sodium chloride 125 mL/hr at 02/28/22 1006    Diet:  Diet Order             Diet regular Room service appropriate? Yes; Fluid consistency: Thin  Diet effective now                   Antimicrobials: Anti-infectives (From admission, onward)    None       Skin assessment:       Nutritional status:  Body mass index is 27.69 kg/m.          Objective: Vitals:   02/28/22 0351 02/28/22 0814  BP: (!) 144/96 (!) 126/96  Pulse: 78 89  Resp: 17 18  Temp: 98.4 F (36.9 C) 99.2 F (37.3 C)  SpO2: 100% 100%    Intake/Output Summary (Last 24 hours) at 02/28/2022 1059 Last data filed at 02/28/2022 1007 Gross per 24 hour  Intake 1592.84 ml  Output 1100 ml  Net 492.84 ml   Filed Weights   02/26/22 1553 02/28/22 0500  Weight: 81.6 kg 80.2 kg   Weight change: -1.448 kg Body mass index is 27.69 kg/m.   Physical Exam: General exam: Pleasant,  middle-aged African-American male. Skin: No rashes, lesions or ulcers. HEENT: Atraumatic, normocephalic, no obvious bleeding Lungs: Clear to auscultation bilaterally CVS: Regular rate and rhythm, no murmur GI/Abd soft, nontender, nondistended, bowel sound present CNS: Alert, awake, oriented x  3.  Improving alcohol tremors Psychiatry: Mood appropriate Extremities: No pedal edema, no calf tenderness  Data Review: I have personally reviewed the laboratory data and studies available.  F/u labs ordered Unresulted Labs (From admission, onward)    None       Total time spent in review of labs and imaging, patient evaluation, formulation of plan, documentation and communication with family: 39 minutes  Signed, Terrilee Croak, MD [facility] 02/28/2022

## 2022-02-28 NOTE — TOC Transition Note (Signed)
Transition of Care [facility]) - CM/SW Discharge Note   Patient Details  Name: Jonathon Guerrero MRN: [id number] Date of Birth: Jan 23, 1970  Transition of Care [facility]) CM/SW Contact:  Pollie Friar, RN Phone Number: 02/28/2022, 1:55 PM   Clinical Narrative:    Will need therapies to eval before able to find him an alcohol rehab. He will need to be IADL to attend.     Barriers to Discharge: Continued Medical Work up   Patient Goals and CMS Choice      Discharge Placement                         Discharge Plan and Services Additional resources added to the After Visit Summary for     Discharge Planning Services: CM Consult                                 Social Determinants of Health (SDOH) Interventions SDOH Screenings   Food Insecurity: Food Insecurity Present (02/27/2022)  Housing: Medium Risk (02/27/2022)  Transportation Needs: No Transportation Needs (02/27/2022)  Utilities: Not At Risk (02/27/2022)  Depression (PHQ2-9): Low Risk  (06/29/2021)  Tobacco Use: High Risk (02/27/2022)     Readmission Risk Interventions     No data to display

## 2022-03-01 MED ORDER — GABAPENTIN 100 MG PO CAPS: 100.0000 mg | ORAL_CAPSULE | Freq: Three times a day (TID) | ORAL | 0 refills | Status: AC

## 2022-03-01 NOTE — Evaluation (Signed)
Physical Therapy Evaluation Patient Details Name: Jonathon Guerrero MRN: [id number] DOB: [birth year] Today's Date: 03/01/2022  History of Present Illness  Pt is 52 yo male who presents on 02/26/22 asking for detox from Et[place]. Pt from [place]. PMH: chronic alcohol use complicated by alcohol withdrawal seizures, hypertension, GERD, Barrett's esophagus, H. pylori gastritis  Clinical Impression  Pt admitted with above diagnosis. Pt from home in [place] with supportive brother 20 mins away and daughters in [place]. Has fallen multiple times in past 4-5 months. Pt presents with cognitive deficits in STM and problem solving. He also has balance impairment with consistent LOB to L during ambulation needing min A to correct. Expect he will progress well and be ready to attend alcohol rehab which is his goal. Pt with SOB during mobility with SPO2 95% on RA and HR 105 bpm.  Pt currently with functional limitations due to the deficits listed below (see PT Problem List). Pt will benefit from skilled PT to increase their independence and safety with mobility to allow discharge to the venue listed below.          Recommendations for follow up therapy are one component of a multi-disciplinary discharge planning process, led by the attending physician.  Recommendations may be updated based on patient status, additional functional criteria and insurance authorization.  Follow Up Recommendations Home health PT (at substance abuse rehab if possible. If not, would prioritize rehab over [facility])      Assistance Recommended at Discharge Intermittent Supervision/Assistance  Patient can return home with the following  A little help with walking and/or transfers;A little help with bathing/dressing/bathroom;Assistance with cooking/housework;Direct supervision/assist for medications management;Direct supervision/assist for financial management;Assist for transportation;Help with stairs or ramp for entrance    Equipment Recommendations Other  (comment) (TBD, maybe RW)  Recommendations for Other Services       Functional Status Assessment Patient has had a recent decline in their functional status and demonstrates the ability to make significant improvements in function in a reasonable and predictable amount of time.     Precautions / Restrictions Precautions Precautions: Fall Precaution Comments: has had multiple falls (4+) in past 4 months, sometimes has been able to get self up, sometimes not. Some falls have been due to seizures. Restrictions Weight Bearing Restrictions: No      Mobility  Bed Mobility               General bed mobility comments: pt up in chair with OT    Transfers Overall transfer level: Needs assistance Equipment used: None Transfers: Sit to/from Stand Sit to Stand: Min guard           General transfer comment: unsteadiness to L with min A to correct    Ambulation/Gait Ambulation/Gait assistance: Min assist Gait Distance (Feet): 200 Feet Assistive device: None Gait Pattern/deviations: Step-through pattern, Staggering left Gait velocity: decreased Gait velocity interpretation: 1.31 - 2.62 ft/sec, indicative of limited community ambulator   General Gait Details: pt with frequent L stagger and decreased awareness so minimal self correction, needed min A to correct.  Stairs Stairs: Yes Stairs assistance: Supervision Stair Management: One rail Left, Alternating pattern, Forwards Number of Stairs: 5 General stair comments: safe with use of rail, noted 3/4 DOE after stairs, HR 105 bpm, SPO2 95%  Wheelchair Mobility    Modified Rankin (Stroke Patients Only)       Balance Overall balance assessment: Needs assistance Sitting-balance support: No upper extremity supported, Feet supported Sitting balance-Leahy Scale: Fair Sitting balance - Comments:  supervision statically, dynamically up to min assist   Standing balance support: During functional activity, No upper extremity  supported Standing balance-Leahy Scale: Fair Standing balance comment: loses balance to L with perturbation                             Pertinent Vitals/Pain Pain Assessment Pain Assessment: No/denies pain    Home Living Family/patient expects to be discharged to:: Private residence Living Arrangements: Alone Available Help at Discharge: Family;Available PRN/intermittently Type of Home: Mobile home Home Access: Stairs to enter Entrance Stairs-Rails: Can reach both Entrance Stairs-Number of Steps: 5   Home Layout: One level Home Equipment: None Additional Comments: brother lives 20 mins away from him and checks on him daily (they are in [place]). Daughters are in [place]    Prior Function Prior Level of Function : Independent/Modified Independent;Driving             Mobility Comments: independent with no AD ADLs Comments: independent ADLs, driving but not working     Hand Dominance   Dominant Hand: Right    Extremity/Trunk Assessment   Upper Extremity Assessment Upper Extremity Assessment: Defer to OT evaluation    Lower Extremity Assessment Lower Extremity Assessment: Generalized weakness    Cervical / Trunk Assessment Cervical / Trunk Assessment: Normal  Communication   Communication: No difficulties  Cognition Arousal/Alertness: Awake/alert Behavior During Therapy: WFL for tasks assessed/performed Overall Cognitive Status: Impaired/Different from baseline Area of Impairment: Memory, Awareness, Problem solving                     Memory: Decreased short-term memory     Awareness: Emergent Problem Solving: Slow processing, Requires verbal cues General Comments: slow processing, STM deficits, doesn't recognize nuances in conversation        General Comments General comments (skin integrity, edema, etc.): pt relays that he's committed to stop drinking. He reports that his brother does not drink at all and will be a good influence on  him    Exercises     Assessment/Plan    PT Assessment Patient needs continued PT services  PT Problem List Decreased strength;Decreased activity tolerance;Decreased balance;Decreased mobility;Decreased coordination;Decreased cognition;Decreased knowledge of use of DME;Decreased safety awareness;Decreased knowledge of precautions       PT Treatment Interventions DME instruction;Gait training;Stair training;Functional mobility training;Therapeutic activities;Therapeutic exercise;Balance training;Neuromuscular re-education;Cognitive remediation;Patient/family education    PT Goals (Current goals can be found in the Care Plan section)  Acute Rehab PT Goals Patient Stated Goal: go to rehab and then return to [place] PT Goal Formulation: With patient Time For Goal Achievement: 03/14/22 Potential to Achieve Goals: Good    Frequency Min 3X/week     Co-evaluation               AM-PAC PT "6 Clicks" Mobility  Outcome Measure Help needed turning from your back to your side while in a flat bed without using bedrails?: None Help needed moving from lying on your back to sitting on the side of a flat bed without using bedrails?: None Help needed moving to and from a bed to a chair (including a wheelchair)?: A Little Help needed standing up from a chair using your arms (e.g., wheelchair or bedside chair)?: A Little Help needed to walk in hospital room?: A Little Help needed climbing 3-5 steps with a railing? : A Little 6 Click Score: 20    End of Session Equipment Utilized During Treatment: Gait  belt Activity Tolerance: Patient tolerated treatment well Patient left: in bed;with call bell/phone within reach;with bed alarm set Nurse Communication: Mobility status PT Visit Diagnosis: Difficulty in walking, not elsewhere classified (R26.2);Repeated falls (R29.6);Muscle weakness (generalized) (M62.81)    Time: [id number] PT Time Calculation (min) (ACUTE ONLY): 25 min   Charges:   PT  Evaluation $PT Eval Moderate Complexity: 1 Mod PT Treatments $Gait Training: 8-22 mins        Leighton Roach, PT  Acute Rehab Services Secure chat preferred Office [place] 03/01/2022, 12:26 PM

## 2022-03-01 NOTE — Evaluation (Signed)
Occupational Therapy Evaluation Patient Details Name: Jonathon Guerrero MRN: [id number] DOB: 03/26/1970 Today's Date: 03/01/2022   History of Present Illness Pt is 52 yo male who presents on 02/26/22 asking for detox from [facility]. Pt from [place]. PMH: chronic alcohol use complicated by alcohol withdrawal seizures, hypertension, GERD, Barrett's esophagus, H. pylori gastritis   Clinical Impression   PTA patient independent and driving. Admitted for above and presents with problem list below.  He completes bed mobility with independence, transfers with min guard, functional mobility in room with min assist to min guard and Adls with up to min assist.  Pt demonstrating decreased short term memory, awareness and problem solving during session. Pt hopeful for alcohol rehab at dc, but will need to be independent in ADLs and functional mobility.  Will follow acutely to optimize independence and safety prior to dc.  If dcs home would recommend 24/7 support.       Recommendations for follow up therapy are one component of a multi-disciplinary discharge planning process, led by the attending physician.  Recommendations may be updated based on patient status, additional functional criteria and insurance authorization.   Follow Up Recommendations  Other (comment) (alcohol rehab)     Assistance Recommended at Discharge Frequent or constant Supervision/Assistance  Patient can return home with the following A little help with walking and/or transfers;A little help with bathing/dressing/bathroom;Assistance with cooking/housework;Direct supervision/assist for medications management;Direct supervision/assist for financial management;Assist for transportation;Help with stairs or ramp for entrance    Functional Status Assessment  Patient has had a recent decline in their functional status and demonstrates the ability to make significant improvements in function in a reasonable and predictable amount of time.   Equipment Recommendations  Other (comment) (defer)    Recommendations for Other Services       Precautions / Restrictions Precautions Precautions: Fall Restrictions Weight Bearing Restrictions: No      Mobility Bed Mobility Overal bed mobility: Independent                  Transfers Overall transfer level: Needs assistance   Transfers: Sit to/from Stand Sit to Stand: Min guard           General transfer comment: mild unsteadiness in standing      Balance Overall balance assessment: Needs assistance Sitting-balance support: No upper extremity supported, Feet supported Sitting balance-Leahy Scale: Fair Sitting balance - Comments: supervision statically, dynamically up to min assist   Standing balance support: During functional activity, No upper extremity supported Standing balance-Leahy Scale: Fair                             ADL either performed or assessed with clinical judgement   ADL Overall ADL's : Needs assistance/impaired     Grooming: Min guard;Oral care;Standing           Upper Body Dressing : Set up;Sitting   Lower Body Dressing: Minimal assistance;Sit to/from stand Lower Body Dressing Details (indicate cue type and reason): requires min assist for dynamic balance donning L sock, min guard in standing Toilet Transfer: Min guard;Ambulation   Toileting- Clothing Manipulation and Hygiene: Minimal assistance;Sit to/from stand       Functional mobility during ADLs: Min guard;Cueing for safety       Vision   Vision Assessment?: No apparent visual deficits     Perception     Praxis      Pertinent Vitals/Pain Pain Assessment Pain Assessment: No/denies pain  Hand Dominance Right   Extremity/Trunk Assessment Upper Extremity Assessment Upper Extremity Assessment: Generalized weakness   Lower Extremity Assessment Lower Extremity Assessment: Defer to PT evaluation       Communication  Communication Communication: No difficulties   Cognition Arousal/Alertness: Awake/alert Behavior During Therapy: WFL for tasks assessed/performed Overall Cognitive Status: Impaired/Different from baseline Area of Impairment: Memory, Awareness, Problem solving                     Memory: Decreased short-term memory     Awareness: Emergent Problem Solving: Slow processing, Requires verbal cues General Comments: oriented, completes short blessed with only deficits in STM scoring 2/28 (normal) but demonstrates decreased awareness and problem solving functionally     General Comments       Exercises     Shoulder Instructions      Home Living Family/patient expects to be discharged to:: Private residence Living Arrangements: Alone   Type of Home: Mobile home Home Access: Stairs to enter Technical brewer of Steps: 5 Entrance Stairs-Rails: Can reach both Home Layout: One level     Bathroom Shower/Tub: Teacher, early years/pre: Standard     Home Equipment: None          Prior Functioning/Environment Prior Level of Function : Independent/Modified Independent;Driving             Mobility Comments: independent with no AD, 4 falls in the last 6 months ADLs Comments: independent ADLs, driving but not working        OT Problem List: Decreased strength;Decreased activity tolerance;Impaired balance (sitting and/or standing);Decreased knowledge of precautions;Decreased knowledge of use of DME or AE;Decreased safety awareness;Decreased cognition      OT Treatment/Interventions: Self-care/ADL training;DME and/or AE instruction;Therapeutic exercise;Therapeutic activities;Balance training;Patient/family education;Cognitive remediation/compensation    OT Goals(Current goals can be found in the care plan section) Acute Rehab OT Goals Patient Stated Goal: get better OT Goal Formulation: With patient Time For Goal Achievement: 03/15/22 Potential to  Achieve Goals: Good  OT Frequency: Min 2X/week    Co-evaluation              AM-PAC OT "6 Clicks" Daily Activity     Outcome Measure Help from another person eating meals?: None Help from another person taking care of personal grooming?: A Little Help from another person toileting, which includes using toliet, bedpan, or urinal?: A Little Help from another person bathing (including washing, rinsing, drying)?: A Little Help from another person to put on and taking off regular upper body clothing?: A Little Help from another person to put on and taking off regular lower body clothing?: A Little 6 Click Score: 19   End of Session Equipment Utilized During Treatment: Gait belt Nurse Communication: Mobility status  Activity Tolerance: Patient tolerated treatment well Patient left: in chair;Other (comment) (PT present)  OT Visit Diagnosis: Other abnormalities of gait and mobility (R26.89);Muscle weakness (generalized) (M62.81);Other symptoms and signs involving cognitive function;History of falling (Z91.81)                Time: [id number] OT Time Calculation (min): 27 min Charges:  OT General Charges $OT Visit: 1 Visit OT Evaluation $OT Eval Moderate Complexity: 1 Mod OT Treatments $Self Care/Home Management : 8-22 mins  Jolaine Artist, OT Acute Rehabilitation Services Office [phone]   Delight Stare 03/01/2022, 11:52 AM

## 2022-03-01 NOTE — TOC Transition Note (Signed)
Transition of Care [facility]) - CM/SW Discharge Note   Patient Details  Name: Jonathon Guerrero MRN: [id number] Date of Birth: 1970-12-07  Transition of Care [facility]) CM/SW Contact:  Pollie Friar, RN Phone Number: 03/01/2022, 1:51 PM   Clinical Narrative:    Pt is discharging home with self care. CM unable to arrange [facility] as he lives in [place], [place] and has no health insurance. MD updated. Pt hoping to get into alcohol rehab. CM has called several rehabs:  [facility] wont take as he lives in [place] numbers they provided go to voicemail for [facility] no bed until next week--they did let me fax his information: [facility] in [place]--wont take without insurance [employer]--insurance needed or private pay but can't afford  CM has gotten a Environmental consultant ordered with a LOG through [employer] and they will deliver the DME to the room. Pts daughter to provide transport home.    Final next level of care: Home/Self Care Barriers to Discharge: Inadequate or no insurance, Barriers Unresolved (comment)   Patient Goals and CMS Choice      Discharge Placement                         Discharge Plan and Services Additional resources added to the After Visit Summary for     Discharge Planning Services: CM Consult                                 Social Determinants of Health (SDOH) Interventions SDOH Screenings   Food Insecurity: Food Insecurity Present (02/27/2022)  Housing: Medium Risk (02/27/2022)  Transportation Needs: No Transportation Needs (02/27/2022)  Utilities: Not At Risk (02/27/2022)  Depression (PHQ2-9): Low Risk  (06/29/2021)  Tobacco Use: High Risk (02/27/2022)     Readmission Risk Interventions     No data to display

## 2022-03-01 NOTE — Discharge Summary (Signed)
Physician Discharge Summary  Jonathon Guerrero [id number] DOB: 06/17/1970 DOA: 02/26/2022  PCP: Alvira Monday, FNP  Admit date: 02/26/2022 Discharge date: 03/01/2022  Admitted From: Home Discharge disposition: Home with home health PT  Recommendations at discharge:  Stop alcohol   Brief narrative: Jonathon Guerrero is a 52 y.o. male with PMH significant for chronic alcohol use complicated by alcohol withdrawal seizures, hypertension, GERD, Barrett's esophagus, H. pylori gastritis 2/24, patient presented to the ED asking for detox from alcohol.  Patient apparently drinks a pint of liquor every other day, sixpack of beer daily.  Last reported drink was 3 to 4 days ago.  Since then, he has been getting shaky and nervous.  He also states he has been lately having gait imbalance. He reportedly was hospitalized at the [facility] 4 days ago for alcohol withdrawal seizures, but he left AMA.  He felt withdrawal symptoms coming back again and hence came to the ED.  In the ED, is afebrile, heart rate 103, blood pressure 132/86, breathing on room air. Initial labs showed potassium low at 2.7, hemoglobin 10 with MCV 110, ammonia level elevated to 39 Blood alcohol level 39. CT head did not show any evidence of acute intracranial process. Patient was given potassium replacement, IV hydration Admitted to [facility]  Subjective: Patient was seen and examined this morning.   Lying on bed.  Not in distress.  No new symptoms.  Walked with PT today. Mental status stable.  No withdrawal symptoms noted today.  Assessment and plan: Acute alcohol withdrawal symptoms Chronic alcohol abuse Presented to the ED asking for detox Apparently, patient was hospitalized at an outside hospital 4 days ago for alcohol withdrawal seizures and left AMA.  It seems patient drank after that as well.  Blood alcohol level was elevated to 39 at presentation. He was started on detox protocol with scheduled Librium taper  and as needed IV Ativan.  His alcohol withdrawal symptoms gradually improved.  Not tremors this morning.  Cognitively intact.  Feels ready for discharge today. Counseled to quit alcohol. TOC provided resources.  Fatty liver Liver mass 2/25, ultrasound right upper quadrant showed fatty infiltration of the liver as well as hypoechoic 1.7 cm mass in the right lobe of liver.  MRI liver was ordered for further characterization and to rule out [place].  Per the tech's note, patient would not cooperate for exam and hence the imaging was aborted. I have advised patient and his daughter to follow-up with PCP as an outpatient in a month for repeat attempt of imaging.  Impaired gait Reports gait imbalance lately when he tries to get up and walk.  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  Vitamin B12 and folate levels normal as below. He has been started on Neurontin 100 mg 3 times daily for now. PT eval obtained.  Home with PT recommended Recent Labs    02/27/22 0226 02/27/22 1601 02/28/22 0426  MCV 108.0*  --  106.8*  VITAMINB12  --  554  --   FOLATE  --  19.4  --    Hypokalemia/hypomagnesemia Potassium and magnesium level were low.  Replacement given. Recent Labs  Lab 02/26/22 1602 02/27/22 0226 02/28/22 0426  K 2.7* 3.3* 3.4*  MG  --  1.4*  --   PHOS  --  3.6  --    Chronic macrocytic anemia Likely related to alcohol.  Hemoglobin stable as below.  No active bleeding at this time.  Continue to monitor. Recent  Labs    02/26/22 1602 02/27/22 0226 02/27/22 1535 02/27/22 1601 02/28/22 0426  HGB 10.0* 8.9*  --   --  9.3*  MCV 110.2* 108.0*  --   --  106.8*  VITAMINB12  --   --   --  554  --   FOLATE  --   --   --  19.4  --   FERRITIN  --   --  74  --   --   TIBC  --   --  288  --   --   IRON  --   --  55  --   --   RETICCTPCT  --   --  3.5*  --   --    GERD/reflux esophagitis Barrett's esophagus Continue Protonix.   Essential hypertension PTA on amlodipine  5 mg daily, lisinopril 10 mg daily Continue both  Allergic rhinitis As needed Flonase  Anxiety/depression Celexa 20 mg daily    Chronic tobacco abuse Counseled to quit.  Goals of care   Code Status: Full Code   Wounds:  -    Discharge Exam:   Vitals:   03/01/22 0017 03/01/22 0424 03/01/22 0718 03/01/22 1103  BP: 119/80 (!) 136/105 (!) 157/100 117/87  Pulse: 77 83 90 81  Resp: '19 18 18 18  '$ Temp: 97.7 F (36.5 C) 98 F (36.7 C) 99.2 F (37.3 C) 98.6 F (37 C)  TempSrc: Oral Oral Oral Oral  SpO2: 99% 100% 100% 100%  Weight:      Height:        Body mass index is 27.69 kg/m.   General exam: Pleasant, middle-aged African-American male. Skin: No rashes, lesions or ulcers. HEENT: Atraumatic, normocephalic, no obvious bleeding Lungs: Clear to auscultation bilaterally CVS: Regular rate and rhythm, no murmur GI/Abd soft, nontender, nondistended, bowel sound present CNS: Alert, awake, oriented x 3.  Improved alcohol tremors. Psychiatry: Mood appropriate Extremities: No pedal edema, no calf tenderness  Follow ups:    Follow-up Information     Alvira Monday, FNP Follow up.   Specialty: Family Medicine Why: ask about financial aide forms Contact information: [address] [place] [address] [phone]                 Discharge Instructions:   Discharge Instructions     Call MD for:  difficulty breathing, headache or visual disturbances   Complete by: As directed    Call MD for:  extreme fatigue   Complete by: As directed    Call MD for:  hives   Complete by: As directed    Call MD for:  persistant dizziness or light-headedness   Complete by: As directed    Call MD for:  persistant nausea and vomiting   Complete by: As directed    Call MD for:  severe uncontrolled pain   Complete by: As directed    Call MD for:  temperature >100.4   Complete by: As directed    Diet general   Complete by: As directed    Discharge instructions    Complete by: As directed    Recommendations at discharge:   Stop alcohol  General discharge instructions: Follow with Primary MD Alvira Monday, FNP in 7 days  Please request your PCP  to go over your hospital tests, procedures, radiology results at the follow up. Please get your medicines reviewed and adjusted.  Your PCP may decide to repeat certain labs or tests as needed. Do not drive, operate  heavy machinery, perform activities at heights, swimming or participation in water activities or provide baby sitting services if your were admitted for syncope or siezures until you have seen by Primary MD or a Neurologist and advised to do so again. [place] Controlled Substance Reporting System database was reviewed. Do not drive, operate heavy machinery, perform activities at heights, swim, participate in water activities or provide baby-sitting services while on medications for pain, sleep and mood until your outpatient physician has reevaluated you and advised to do so again.  You are strongly recommended to comply with the dose, frequency and duration of prescribed medications. Activity: As tolerated with Full fall precautions use walker/cane & assistance as needed Avoid using any recreational substances like cigarette, tobacco, alcohol, or non-prescribed drug. If you experience worsening of your admission symptoms, develop shortness of breath, life threatening emergency, suicidal or homicidal thoughts you must seek medical attention immediately by calling 911 or calling your MD immediately  if symptoms less severe. You must read complete instructions/literature along with all the possible adverse reactions/side effects for all the medicines you take and that have been prescribed to you. Take any new medicine only after you have completely understood and accepted all the possible adverse reactions/side effects.  Wear Seat belts while driving. You were cared for by a hospitalist during your  hospital stay. If you have any questions about your discharge medications or the care you received while you were in the hospital after you are discharged, you can call the unit and ask to speak with the hospitalist or the covering physician. Once you are discharged, your primary care physician will handle any further medical issues. Please note that NO REFILLS for any discharge medications will be authorized once you are discharged, as it is imperative that you return to your primary care physician (or establish a relationship with a primary care physician if you do not have one).   Increase activity slowly   Complete by: As directed        Discharge Medications:   Allergies as of 03/01/2022       Reactions   Sardine Flavor    Shellfish Allergy Hives   Poison Sumac Extract Itching, Swelling, Rash        Medication List     TAKE these medications    albuterol 108 (90 Base) MCG/ACT inhaler Commonly known as: VENTOLIN HFA Inhale 2 puffs into the lungs every 6 (six) hours as needed for wheezing or shortness of breath.   amLODipine 5 MG tablet Commonly known as: NORVASC Take 1 tablet (5 mg total) by mouth daily.   citalopram 20 MG tablet Commonly known as: CELEXA Take 1 tablet (20 mg total) by mouth daily.   fluticasone 50 MCG/ACT nasal spray Commonly known as: FLONASE Place 2 sprays into both nostrils daily.   gabapentin 100 MG capsule Commonly known as: NEURONTIN Take 1 capsule (100 mg total) by mouth 3 (three) times daily.   HYDROcodone-acetaminophen 5-325 MG tablet Commonly known as: NORCO/VICODIN Take 1-2 tablets every 6 (six) hours as needed by mouth.   lisinopril 10 MG tablet Commonly known as: ZESTRIL Take 1 tablet (10 mg total) by mouth daily. What changed: Another medication with the same name was removed. Continue taking this medication, and follow the directions you see here.   omeprazole 40 MG capsule Commonly known as: PRILOSEC Take 1 capsule (40 mg  total) by mouth daily.         The results of significant diagnostics from  this hospitalization (including imaging, microbiology, ancillary and laboratory) are listed below for reference.    Procedures and Diagnostic Studies:   US Abdomen Limited RUQ (LIVER/GB)  Result Date: 02/27/2022 CLINICAL DATA:  Alcohol abuse. EXAM: ULTRASOUND ABDOMEN LIMITED RIGHT UPPER QUADRANT COMPARISON:  CT abdomen dated 11/21/2016 FINDINGS: Gallbladder: Gallbladder is contracted but otherwise unremarkable. No gallstones seen. No evidence of gallbladder wall thickening. No sonographic Murphy's sign elicited during the exam. Common bile duct: Diameter: 3 mm Liver: Liver is diffusely echogenic suggesting fatty infiltration. Hypoechoic mass is seen within the RIGHT liver lobe, measuring 1.7 x 1.4 x 1.5 cm. Portal vein is patent on color Doppler imaging with normal direction of blood flow towards the liver. Other: None. IMPRESSION: 1. Hypoechoic mass within the RIGHT liver lobe, measuring 1.7 cm. This finding is indeterminate but hepatocellular carcinoma is not excluded. Recommend nonemergent liver MRI further characterization. 2. Fatty infiltration of the liver. 3. Gallbladder is contracted but otherwise unremarkable. No gallstones. No evidence of cholecystitis. No bile duct dilatation. Electronically Signed   By: Franki Cabot M.D.   On: 02/27/2022 11:14   CT Head Wo Contrast  Result Date: 02/26/2022 CLINICAL DATA:  53 year old male with recent falls.  Alcohol abuse. EXAM: CT HEAD WITHOUT CONTRAST TECHNIQUE: Contiguous axial images were obtained from the base of the skull through the vertex without intravenous contrast. RADIATION DOSE REDUCTION: This exam was performed according to the departmental dose-optimization program which includes automated exposure control, adjustment of the mA and/or kV according to patient size and/or use of iterative reconstruction technique. COMPARISON:  09/19/2021 CT and prior studies  FINDINGS: Brain: No evidence of acute infarction, hemorrhage, hydrocephalus, extra-axial collection or mass lesion/mass effect. Atrophy and chronic small-vessel white matter ischemic changes again noted. Vascular: No hyperdense vessel or unexpected calcification. Skull: Normal. Negative for fracture or focal lesion. Sinuses/Orbits: No acute finding. Other: None. IMPRESSION: 1. No evidence of acute intracranial abnormality. 2. Atrophy and chronic small-vessel white matter ischemic changes. Electronically Signed   By: Margarette Canada M.D.   On: 02/26/2022 18:41     Labs:   Basic Metabolic Panel: Recent Labs  Lab 02/26/22 1602 02/27/22 0226 02/28/22 0426  NA 138 139 135  K 2.7* 3.3* 3.4*  CL 103 106 103  CO2 24 20* 24  GLUCOSE 87 122* 93  BUN <5* <5* <5*  CREATININE 0.81 0.86 0.79  CALCIUM 8.5* 8.0* 8.1*  MG  --  1.4*  --   PHOS  --  3.6  --    GFR Estimated Creatinine Clearance: 113.3 mL/min (by C-G formula based on SCr of 0.79 mg/dL). Liver Function Tests: Recent Labs  Lab 02/26/22 1602 02/27/22 0226  AST 52* 45*  ALT 18 16  ALKPHOS 92 81  BILITOT 1.0 0.5  PROT 6.6 5.5*  ALBUMIN 3.6 2.9*   No results for input(s): "LIPASE", "AMYLASE" in the last 168 hours. Recent Labs  Lab 02/26/22 1811  AMMONIA 39*   Coagulation profile Recent Labs  Lab 02/27/22 1535  INR 1.0    CBC: Recent Labs  Lab 02/26/22 1602 02/27/22 0226 02/28/22 0426  WBC 4.8 4.0 4.5  NEUTROABS 3.0 2.2 2.5  HGB 10.0* 8.9* 9.3*  HCT 30.2* 25.7* 26.7*  MCV 110.2* 108.0* 106.8*  PLT 103* 102* 134*   Cardiac Enzymes: No results for input(s): "CKTOTAL", "CKMB", "CKMBINDEX", "TROPONINI" in the last 168 hours. BNP: Invalid input(s): "POCBNP" CBG: No results for input(s): "GLUCAP" in the last 168 hours. D-Dimer No results for input(s): "DDIMER" in the  last 72 hours. Hgb A1c No results for input(s): "HGBA1C" in the last 72 hours. Lipid Profile No results for input(s): "CHOL", "HDL", "LDLCALC",  "TRIG", "CHOLHDL", "LDLDIRECT" in the last 72 hours. Thyroid function studies No results for input(s): "TSH", "T4TOTAL", "T3FREE", "THYROIDAB" in the last 72 hours.  Invalid input(s): "FREET3" Anemia work up Recent Labs    02/27/22 1535 02/27/22 1601  VITAMINB12  --  554  FOLATE  --  19.4  FERRITIN 74  --   TIBC 288  --   IRON 55  --   RETICCTPCT 3.5*  --    Microbiology Recent Results (from the past 240 hour(s))  Resp panel by RT-PCR (RSV, Flu A&B, Covid) Anterior Nasal Swab     Status: None   Collection Time: 02/26/22  4:03 PM   Specimen: Anterior Nasal Swab  Result Value Ref Range Status   SARS Coronavirus 2 by RT PCR NEGATIVE NEGATIVE Final   Influenza A by PCR NEGATIVE NEGATIVE Final   Influenza B by PCR NEGATIVE NEGATIVE Final    Comment: (NOTE) The Xpert Xpress SARS-CoV-2/FLU/RSV plus assay is intended as an aid in the diagnosis of influenza from Nasopharyngeal swab specimens and should not be used as a sole basis for treatment. Nasal washings and aspirates are unacceptable for Xpert Xpress SARS-CoV-2/FLU/RSV testing.  Fact Sheet for Patients: EntrepreneurPulse.com.au  Fact Sheet for Healthcare Providers: IncredibleEmployment.be  This test is not yet approved or cleared by the [place] FDA and has been authorized for detection and/or diagnosis of SARS-CoV-2 by FDA under an Emergency Use Authorization (EUA). This EUA will remain in effect (meaning this test can be used) for the duration of the COVID-19 declaration under Section 564(b)(1) of the Act, 21 U.S.C. section 360bbb-3(b)(1), unless the authorization is terminated or revoked.     Resp Syncytial Virus by PCR NEGATIVE NEGATIVE Final    Comment: (NOTE) Fact Sheet for Patients: EntrepreneurPulse.com.au  Fact Sheet for Healthcare Providers: IncredibleEmployment.be  This test is not yet approved or cleared by the [place] FDA  and has been authorized for detection and/or diagnosis of SARS-CoV-2 by FDA under an Emergency Use Authorization (EUA). This EUA will remain in effect (meaning this test can be used) for the duration of the COVID-19 declaration under Section 564(b)(1) of the Act, 21 U.S.C. section 360bbb-3(b)(1), unless the authorization is terminated or revoked.  Performed at [facility], [place] [address] [place], [place] [address]     Time coordinating discharge: 35 minutes  Signed: Levonte Molina  [facility] 03/01/2022, 12:50 PM

## 2022-03-01 NOTE — Progress Notes (Signed)
Discharge packet provided with teach-back method. VS wnL and as per flow. IVs removed, Pt verbalized understanding. All questions and concerns addressed. Awaiting on sisters for transport.

## 2022-03-01 NOTE — Plan of Care (Signed)
  Problem: Education: Goal: Knowledge of General Education information will improve Description: Including pain rating scale, medication(s)/side effects and non-pharmacologic comfort measures Outcome: Progressing   Problem: Clinical Measurements: Goal: Will remain free from infection Outcome: Progressing   

## 2022-03-02 ENCOUNTER — Telehealth: Payer: Self-pay

## 2022-03-02 NOTE — Transitions of Care (Post Inpatient/ED Visit) (Signed)
   03/02/2022  Name: GRACIE RELAFORD MRN: [id number] DOB: November 13, 1970  Today's TOC FU Call Status: Today's TOC FU Call Status:: Unsuccessul Call (1st Attempt) Unsuccessful Call (1st Attempt) Date: 03/02/22  Attempted to reach the patient regarding the most recent Inpatient/ED visit.  Follow Up Plan: Additional outreach attempts will be made to reach the patient to complete the Transitions of Care (Post Inpatient/ED visit) call.   Signature Juanda Crumble, [place] Direct Dial [phone]

## 2022-03-03 NOTE — Transitions of Care (Post Inpatient/ED Visit) (Signed)
   03/03/2022  Name: Jonathon Guerrero MRN: [id number] DOB: 09/21/1970  Today's TOC FU Call Status: Today's TOC FU Call Status:: Unsuccessful Call (2nd Attempt) Unsuccessful Call (1st Attempt) Date: 03/02/22 Unsuccessful Call (2nd Attempt) Date: 03/03/22  Attempted to reach the patient regarding the most recent Inpatient/ED visit.  Follow Up Plan: Additional outreach attempts will be made to reach the patient to complete the Transitions of Care (Post Inpatient/ED visit) call.   Signature Juanda Crumble, [place] Direct Dial [phone]

## 2022-03-04 NOTE — Transitions of Care (Post Inpatient/ED Visit) (Signed)
   03/04/2022  Name: LEGEND KOLODZIEJ MRN: [id number] DOB: 07-Apr-1970  Today's TOC FU Call Status: Today's TOC FU Call Status:: Unsuccessful Call (3rd Attempt) Unsuccessful Call (1st Attempt) Date: 03/02/22 Unsuccessful Call (2nd Attempt) Date: 03/03/22 Unsuccessful Call (3rd Attempt) Date: 03/04/22  Attempted to reach the patient regarding the most recent Inpatient/ED visit.  Follow Up Plan: No further outreach attempts will be made at this time. We have been unable to contact the patient.  Signature Juanda Crumble, [place] Direct Dial [phone]

## 2022-03-05 ENCOUNTER — Encounter: Payer: Self-pay | Admitting: Family Medicine

## 2022-03-07 ENCOUNTER — Other Ambulatory Visit: Payer: Self-pay | Admitting: Family Medicine

## 2022-03-07 NOTE — Telephone Encounter (Signed)
Who is the patient's father?

## 2023-08-31 ENCOUNTER — Encounter (HOSPITAL_COMMUNITY): Payer: Self-pay

## 2023-08-31 ENCOUNTER — Emergency Department (HOSPITAL_COMMUNITY): Payer: Self-pay

## 2023-08-31 ENCOUNTER — Inpatient Hospital Stay (HOSPITAL_COMMUNITY)
Admission: EM | Admit: 2023-08-31 | Discharge: 2023-09-02 | DRG: 897 | Disposition: A | Discharge status: Home / Self Care | Payer: Self-pay | Attending: Family Medicine | Admitting: Family Medicine

## 2023-08-31 ENCOUNTER — Other Ambulatory Visit: Payer: Self-pay

## 2023-08-31 DIAGNOSIS — E860 Dehydration: Secondary | ICD-10-CM | POA: Diagnosis present

## 2023-08-31 DIAGNOSIS — R0789 Other chest pain: Secondary | ICD-10-CM | POA: Diagnosis present

## 2023-08-31 DIAGNOSIS — R0602 Shortness of breath: Secondary | ICD-10-CM

## 2023-08-31 DIAGNOSIS — E876 Hypokalemia: Principal | ICD-10-CM | POA: Diagnosis present

## 2023-08-31 DIAGNOSIS — K2970 Gastritis, unspecified, without bleeding: Secondary | ICD-10-CM | POA: Diagnosis present

## 2023-08-31 DIAGNOSIS — K2100 Gastro-esophageal reflux disease with esophagitis, without bleeding: Secondary | ICD-10-CM | POA: Diagnosis present

## 2023-08-31 DIAGNOSIS — F17210 Nicotine dependence, cigarettes, uncomplicated: Secondary | ICD-10-CM | POA: Diagnosis present

## 2023-08-31 DIAGNOSIS — Z5900 Homelessness unspecified: Secondary | ICD-10-CM

## 2023-08-31 DIAGNOSIS — Z720 Tobacco use: Secondary | ICD-10-CM | POA: Diagnosis present

## 2023-08-31 DIAGNOSIS — Z8249 Family history of ischemic heart disease and other diseases of the circulatory system: Secondary | ICD-10-CM

## 2023-08-31 DIAGNOSIS — G40909 Epilepsy, unspecified, not intractable, without status epilepticus: Secondary | ICD-10-CM | POA: Diagnosis present

## 2023-08-31 DIAGNOSIS — E872 Acidosis, unspecified: Secondary | ICD-10-CM | POA: Diagnosis present

## 2023-08-31 DIAGNOSIS — R0981 Nasal congestion: Secondary | ICD-10-CM

## 2023-08-31 DIAGNOSIS — Y900 Blood alcohol level of less than 20 mg/100 ml: Secondary | ICD-10-CM | POA: Diagnosis present

## 2023-08-31 DIAGNOSIS — D539 Nutritional anemia, unspecified: Secondary | ICD-10-CM | POA: Diagnosis present

## 2023-08-31 DIAGNOSIS — I10 Essential (primary) hypertension: Secondary | ICD-10-CM | POA: Diagnosis present

## 2023-08-31 DIAGNOSIS — F10139 Alcohol abuse with withdrawal, unspecified: Principal | ICD-10-CM | POA: Diagnosis present

## 2023-08-31 DIAGNOSIS — F10930 Alcohol use, unspecified with withdrawal, uncomplicated: Secondary | ICD-10-CM

## 2023-08-31 DIAGNOSIS — Z91013 Allergy to seafood: Secondary | ICD-10-CM | POA: Diagnosis not present

## 2023-08-31 DIAGNOSIS — K2270 Barrett's esophagus without dysplasia: Secondary | ICD-10-CM | POA: Diagnosis present

## 2023-08-31 DIAGNOSIS — F10939 Alcohol use, unspecified with withdrawal, unspecified: Secondary | ICD-10-CM | POA: Diagnosis present

## 2023-08-31 DIAGNOSIS — K219 Gastro-esophageal reflux disease without esophagitis: Secondary | ICD-10-CM | POA: Diagnosis present

## 2023-08-31 DIAGNOSIS — Z79899 Other long term (current) drug therapy: Secondary | ICD-10-CM | POA: Diagnosis not present

## 2023-08-31 DIAGNOSIS — R296 Repeated falls: Secondary | ICD-10-CM | POA: Diagnosis present

## 2023-08-31 DIAGNOSIS — E871 Hypo-osmolality and hyponatremia: Secondary | ICD-10-CM | POA: Diagnosis present

## 2023-08-31 DIAGNOSIS — R1110 Vomiting, unspecified: Secondary | ICD-10-CM

## 2023-08-31 DIAGNOSIS — R112 Nausea with vomiting, unspecified: Secondary | ICD-10-CM | POA: Diagnosis present

## 2023-08-31 DIAGNOSIS — F1010 Alcohol abuse, uncomplicated: Secondary | ICD-10-CM | POA: Diagnosis present

## 2023-08-31 DIAGNOSIS — F32A Depression, unspecified: Secondary | ICD-10-CM | POA: Diagnosis present

## 2023-08-31 LAB — MAGNESIUM: Magnesium: 1.7 mg/dL (ref 1.7–2.4)

## 2023-08-31 LAB — LACTIC ACID, PLASMA
Lactic Acid, Venous: 2.1 mmol/L (ref 0.5–1.9)
Lactic Acid, Venous: 1.2 mmol/L (ref 0.5–1.9)

## 2023-08-31 LAB — BASIC METABOLIC PANEL WITH GFR
Anion gap: 20 — ABNORMAL HIGH (ref 5–15)
BUN: 9 mg/dL (ref 6–20)
CO2: 34 mmol/L — ABNORMAL HIGH (ref 22–32)
Calcium: 8.6 mg/dL — ABNORMAL LOW (ref 8.9–10.3)
Chloride: 78 mmol/L — ABNORMAL LOW (ref 98–111)
Creatinine, Ser: 0.95 mg/dL (ref 0.61–1.24)
GFR, Estimated: >60 mL/min (ref >60)
Glucose, Bld: 118 mg/dL — ABNORMAL HIGH (ref 70–99)
Potassium: 2.2 mmol/L — CL (ref 3.5–5.1)
Sodium: 132 mmol/L — ABNORMAL LOW (ref 135–145)

## 2023-08-31 LAB — TROPONIN I (HIGH SENSITIVITY)
Troponin I (High Sensitivity): 6 ng/L (ref <18)
Troponin I (High Sensitivity): 7 ng/L (ref <18)

## 2023-08-31 LAB — CBC
HCT: 37.7 % — ABNORMAL LOW (ref 39.0–52.0)
Hemoglobin: 13.4 g/dL (ref 13.0–17.0)
MCH: 35.1 pg — ABNORMAL HIGH (ref 26.0–34.0)
MCHC: 35.5 g/dL (ref 30.0–36.0)
MCV: 98.7 fL (ref 80.0–100.0)
Platelets: 210 K/uL (ref 150–400)
RBC: 3.82 MIL/uL — ABNORMAL LOW (ref 4.22–5.81)
RDW: 14.1 % (ref 11.5–15.5)
WBC: 6 K/uL (ref 4.0–10.5)
nRBC: 0 % (ref 0.0–0.2)

## 2023-08-31 LAB — RAPID URINE DRUG SCREEN, HOSP PERFORMED
Amphetamines: NOT DETECTED
Barbiturates: NOT DETECTED
Benzodiazepines: NOT DETECTED
Cocaine: NOT DETECTED
Opiates: POSITIVE — AB
Tetrahydrocannabinol: POSITIVE — AB

## 2023-08-31 LAB — HEPATIC FUNCTION PANEL
ALT: 9 U/L (ref 0–44)
AST: 26 U/L (ref 15–41)
Albumin: 3.6 g/dL (ref 3.5–5.0)
Alkaline Phosphatase: 100 U/L (ref 38–126)
Bilirubin, Direct: 0.2 mg/dL (ref 0.0–0.2)
Indirect Bilirubin: 1 mg/dL — ABNORMAL HIGH (ref 0.3–0.9)
Total Bilirubin: 1.2 mg/dL (ref 0.0–1.2)
Total Protein: 6.7 g/dL (ref 6.5–8.1)

## 2023-08-31 LAB — MRSA NEXT GEN BY PCR, NASAL: MRSA by PCR Next Gen: NOT DETECTED

## 2023-08-31 LAB — LIPASE, BLOOD: Lipase: 35 U/L (ref 11–51)

## 2023-08-31 LAB — BETA-HYDROXYBUTYRIC ACID: Beta-Hydroxybutyric Acid: 0.12 mmol/L (ref 0.05–0.27)

## 2023-08-31 LAB — ETHANOL: Alcohol, Ethyl (B): <15 mg/dL (ref <15)

## 2023-08-31 MED ORDER — LORAZEPAM 2 MG/ML IJ SOLN
1.0000 mg | INTRAMUSCULAR | Status: DC | PRN
  Administered 2023-08-31: 1 mg via INTRAVENOUS
  Filled 2023-08-31: qty 1

## 2023-08-31 MED ORDER — MAGNESIUM SULFATE 2 GM/50ML IV SOLN
2.0000 g | Freq: Once | INTRAVENOUS | Status: AC
  Administered 2023-08-31: 2 g via INTRAVENOUS
  Filled 2023-08-31: qty 50

## 2023-08-31 MED ORDER — CHLORDIAZEPOXIDE HCL 5 MG PO CAPS: 10.0000 mg | ORAL_CAPSULE | Freq: Three times a day (TID) | ORAL | Status: DC

## 2023-08-31 MED ORDER — ONDANSETRON HCL 4 MG PO TABS
4.0000 mg | ORAL_TABLET | Freq: Four times a day (QID) | ORAL | Status: DC | PRN
Start: 2023-08-31 — End: 2023-09-02

## 2023-08-31 MED ORDER — FOLIC ACID 1 MG PO TABS
1.0000 mg | ORAL_TABLET | Freq: Every day | ORAL | Status: DC
  Administered 2023-08-31 – 2023-09-02 (×3): 1 mg via ORAL
  Filled 2023-08-31 (×3): qty 1

## 2023-08-31 MED ORDER — OXYCODONE HCL 5 MG PO TABS: 5.0000 mg | ORAL_TABLET | Freq: Four times a day (QID) | ORAL | Status: DC | PRN

## 2023-08-31 MED ORDER — NICOTINE 21 MG/24HR TD PT24: 21.0000 mg | MEDICATED_PATCH | Freq: Every day | TRANSDERMAL | Status: DC | PRN

## 2023-08-31 MED ORDER — BISACODYL 5 MG PO TBEC: 5.0000 mg | DELAYED_RELEASE_TABLET | Freq: Every day | ORAL | Status: DC | PRN

## 2023-08-31 MED ORDER — ENSURE PLUS HIGH PROTEIN PO LIQD
237.0000 mL | Freq: Two times a day (BID) | ORAL | Status: DC
  Administered 2023-09-01 (×2): 237 mL via ORAL

## 2023-08-31 MED ORDER — LACTATED RINGERS IV BOLUS
1000.0000 mL | Freq: Once | INTRAVENOUS | Status: AC
  Administered 2023-08-31: 1000 mL via INTRAVENOUS

## 2023-08-31 MED ORDER — CHLORHEXIDINE GLUCONATE CLOTH 2 % EX PADS
6.0000 | MEDICATED_PAD | Freq: Every day | CUTANEOUS | Status: DC
  Administered 2023-09-01 – 2023-09-02 (×2): 6 via TOPICAL

## 2023-08-31 MED ORDER — FENTANYL CITRATE (PF) 100 MCG/2ML IJ SOLN
25.0000 ug | INTRAMUSCULAR | Status: DC | PRN
  Administered 2023-08-31: 25 ug via INTRAVENOUS
  Filled 2023-08-31: qty 2

## 2023-08-31 MED ORDER — IOHEXOL 350 MG/ML SOLN
100.0000 mL | Freq: Once | INTRAVENOUS | Status: AC | PRN
  Administered 2023-08-31: 100 mL via INTRAVENOUS

## 2023-08-31 MED ORDER — THIAMINE MONONITRATE 100 MG PO TABS
100.0000 mg | ORAL_TABLET | Freq: Every day | ORAL | Status: DC
  Administered 2023-08-31 – 2023-09-02 (×3): 100 mg via ORAL
  Filled 2023-08-31 (×3): qty 1

## 2023-08-31 MED ORDER — METOCLOPRAMIDE HCL 5 MG/ML IJ SOLN
10.0000 mg | Freq: Once | INTRAMUSCULAR | Status: AC
  Administered 2023-08-31: 10 mg via INTRAVENOUS
  Filled 2023-08-31: qty 2

## 2023-08-31 MED ORDER — ADULT MULTIVITAMIN W/MINERALS CH
1.0000 | ORAL_TABLET | Freq: Every day | ORAL | Status: DC
  Administered 2023-08-31 – 2023-09-02 (×3): 1 via ORAL
  Filled 2023-08-31 (×3): qty 1

## 2023-08-31 MED ORDER — MAGNESIUM SULFATE 2 GM/50ML IV SOLN
2.0000 g | Freq: Once | INTRAVENOUS | Status: AC
  Administered 2023-08-31: 2 g via INTRAVENOUS

## 2023-08-31 MED ORDER — POTASSIUM CHLORIDE 10 MEQ/100ML IV SOLN
10.0000 meq | INTRAVENOUS | Status: AC
  Administered 2023-08-31 (×4): 10 meq via INTRAVENOUS
  Filled 2023-08-31 (×4): qty 100

## 2023-08-31 MED ORDER — LORAZEPAM 1 MG PO TABS
1.0000 mg | ORAL_TABLET | ORAL | Status: DC | PRN
  Administered 2023-09-01: 1 mg via ORAL
  Filled 2023-08-31: qty 1

## 2023-08-31 MED ORDER — ACETAMINOPHEN 325 MG PO TABS: 650.0000 mg | ORAL_TABLET | Freq: Four times a day (QID) | ORAL | Status: DC | PRN

## 2023-08-31 MED ORDER — PROCHLORPERAZINE EDISYLATE 10 MG/2ML IJ SOLN: 10.0000 mg | INTRAMUSCULAR | Status: DC | PRN

## 2023-08-31 MED ORDER — IPRATROPIUM-ALBUTEROL 0.5-2.5 (3) MG/3ML IN SOLN: 3.0000 mL | RESPIRATORY_TRACT | Status: DC | PRN

## 2023-08-31 MED ORDER — ONDANSETRON HCL 4 MG/2ML IJ SOLN: 4.0000 mg | Freq: Four times a day (QID) | INTRAMUSCULAR | Status: DC | PRN

## 2023-08-31 MED ORDER — ONDANSETRON HCL 4 MG/2ML IJ SOLN
INTRAMUSCULAR | Status: AC
  Administered 2023-08-31: 4 mg via INTRAVENOUS
  Filled 2023-08-31: qty 2

## 2023-08-31 MED ORDER — ONDANSETRON HCL 4 MG/2ML IJ SOLN: 4.0000 mg | Freq: Once | INTRAMUSCULAR | Status: AC

## 2023-08-31 MED ORDER — MORPHINE SULFATE (PF) 4 MG/ML IV SOLN
4.0000 mg | Freq: Once | INTRAVENOUS | Status: AC
  Administered 2023-08-31: 4 mg via INTRAVENOUS
  Filled 2023-08-31: qty 1

## 2023-08-31 MED ORDER — CHLORDIAZEPOXIDE HCL 5 MG PO CAPS: 5.0000 mg | ORAL_CAPSULE | Freq: Three times a day (TID) | ORAL | Status: DC

## 2023-08-31 MED ORDER — CHLORDIAZEPOXIDE HCL 25 MG PO CAPS
25.0000 mg | ORAL_CAPSULE | Freq: Three times a day (TID) | ORAL | Status: AC
  Administered 2023-08-31 – 2023-09-02 (×6): 25 mg via ORAL
  Filled 2023-08-31 (×6): qty 1

## 2023-08-31 MED ORDER — THIAMINE HCL 100 MG/ML IJ SOLN: 100.0000 mg | Freq: Every day | INTRAMUSCULAR | Status: DC

## 2023-08-31 MED ORDER — ACETAMINOPHEN 650 MG RE SUPP: 650.0000 mg | Freq: Four times a day (QID) | RECTAL | Status: DC | PRN

## 2023-08-31 MED ORDER — ENOXAPARIN SODIUM 40 MG/0.4ML IJ SOSY
40.0000 mg | PREFILLED_SYRINGE | INTRAMUSCULAR | Status: DC
  Administered 2023-08-31 – 2023-09-01 (×2): 40 mg via SUBCUTANEOUS
  Filled 2023-08-31 (×2): qty 0.4

## 2023-08-31 MED ORDER — POTASSIUM CHLORIDE 2 MEQ/ML IV SOLN
INTRAVENOUS | Status: DC
  Filled 2023-08-31 (×3): qty 1000

## 2023-08-31 NOTE — ED Notes (Signed)
 Patient transported to X-ray

## 2023-08-31 NOTE — ED Notes (Signed)
 Pt taken to xray

## 2023-08-31 NOTE — Plan of Care (Signed)

## 2023-08-31 NOTE — Hospital Course (Addendum)
 53 year old male chronic heavy alcohol abuser, seizure disorder, GERD, Barrett's esophagus, h/o helicobacter gastritis, HTN, reported his last alcoholic drink was yesterday and he presented to the [facility] facility for alcohol withdrawal treatment.  Unfortunately he was experiencing severe nausea and vomiting and ataxia associated with frequent falling down and he was sent from [facility] to the ED.  He reports that he hit his head with a fall. He his having frequent hiccups and he is having chest discomfort and pain since he has been vomiting so frequently.  He denies having diarrhea. He was seen in ED today and noted to have hyponatremia, hypokalemia and low chloride levels consistent with his history of severe emesis.  He was given treatment in the ED and continued to be symptomatic and admission was requested for further management.

## 2023-08-31 NOTE — H&P (Signed)
 History and Physical  [facility]  Jonathon Guerrero FMW:[id number] DOB: 10/28/1970 DOA: 08/31/2023  PCP: Pcp, No  Patient coming from: sent from [facility] Level of care: Stepdown  I have personally briefly reviewed patient's old medical records in [facility]  Chief Complaint: chest pain   HPI: Jonathon Guerrero is a 53 year old male chronic heavy alcohol abuser, seizure disorder, GERD, Barrett's esophagus, h/o helicobacter gastritis, HTN, reported his last alcoholic drink was yesterday and he presented to the [facility] facility for alcohol withdrawal treatment.  Unfortunately he was experiencing severe nausea and vomiting and ataxia associated with frequent falling down and he was sent from [facility] to the ED.  He reports that he hit his head with a fall. He his having frequent hiccups and he is having chest discomfort and pain since he has been vomiting so frequently.  He denies having diarrhea. He was seen in ED today and noted to have hyponatremia, hypokalemia and low chloride levels consistent with his history of severe emesis.  He was given treatment in the ED and continued to be symptomatic and admission was requested for further management.      Past Medical History:  Diagnosis Date   Barrett's esophagus without dysplasia    GERD (gastroesophageal reflux disease)    Helicobacter pylori gastritis 09/2010   completed Prevpac   Hypertension    Reflux esophagitis    severe erosive RE on EGD 09/2010   Seizures [facility])     Past Surgical History:  Procedure Laterality Date   COLONOSCOPY  09/2010   hemorrhoids, small hepatic flexure polyp (ablated)   ESOPHAGOGASTRODUODENOSCOPY  09/2010   severe erosive RE, Barrett's, h.pylori gastritis   POLYPECTOMY  09/09/2010   Dr Shaaron     reports that he has been smoking cigarettes. He has a 6.8 pack-year smoking history. He has never used smokeless tobacco. He reports current alcohol use. He reports current drug use. Drug: Marijuana.  Allergies   Allergen Reactions   Poison Sumac Extract Itching, Swelling, Dermatitis and Rash   Sardine Flavor Other (See Comments)    Unknown    Shellfish Allergy Hives    Family History  Problem Relation Age of Onset   Lung cancer Father 41   Ulcers Father    Hypertension Father    Aneurysm Mother 19       Brain Aneurysm   Hypertension Brother    Seizures Son    Anesthesia problems Neg Hx    Hypotension Neg Hx    Malignant hyperthermia Neg Hx    Pseudochol deficiency Neg Hx     Prior to Admission medications   Medication Sig Start Date End Date Taking? Authorizing Provider  levETIRAcetam (KEPPRA) 500 MG tablet Take 500 mg by mouth 2 (two) times daily. 08/04/23  Yes [provider]  albuterol  (VENTOLIN  HFA) 108 (90 Base) MCG/ACT inhaler Inhale 2 puffs into the lungs every 6 (six) hours as needed for wheezing or shortness of breath. 06/29/21   Zarwolo, Gloria, FNP  amLODipine  (NORVASC ) 5 MG tablet Take 1 tablet (5 mg total) by mouth daily. 06/29/21   Zarwolo, Gloria, FNP  citalopram  (CELEXA ) 20 MG tablet Take 1 tablet (20 mg total) by mouth daily. 06/29/21   Zarwolo, Gloria, FNP  fluticasone  (FLONASE ) 50 MCG/ACT nasal spray Place 2 sprays into both nostrils daily. 06/29/21   Zarwolo, Gloria, FNP  gabapentin  (NEURONTIN ) 100 MG capsule Take 1 capsule (100 mg total) by mouth 3 (three) times daily. 03/01/22 03/31/22  Arlice Reichert, MD  HYDROcodone -acetaminophen  (NORCO/VICODIN) 5-325 MG tablet Take 1-2 tablets every 6 (six) hours as needed by mouth. Patient not taking: Reported on 06/29/2021 11/21/16   Liu, Dana Duo, MD  lisinopril  (PRINIVIL ,ZESTRIL ) 10 MG tablet Take 1 tablet (10 mg total) by mouth daily. 09/29/16   Comer Kirsch, PA-C  loratadine  (CLARITIN ) 10 MG tablet Take 10 mg by mouth daily. 04/04/23   [provider]  omeprazole  (PRILOSEC) 40 MG capsule Take 1 capsule (40 mg total) by mouth daily. 06/29/21   Zarwolo, Gloria, FNP    Physical Exam: Vitals:   08/31/23 1200  08/31/23 1230 08/31/23 1455 08/31/23 1455  BP: (!) 136/98 126/69  135/75  Pulse: 82 79 77   Resp: (!) 21 (!) 21    Temp:      TempSrc:      SpO2: 98% 93% 94%   Weight:      Height:        Constitutional: NAD, calm, uncomfortable Eyes: PERRL, lids and conjunctivae normal ENMT: Mucous membranes are dry. Posterior pharynx clear of any exudate or lesions.  Poor dentition.  Neck: normal, supple, no masses, no thyromegaly Respiratory: clear to auscultation bilaterally, no wheezing, no crackles. Mild tachypnea. No accessory muscle use.  Cardiovascular: normal s1, s2 sounds, no murmurs / rubs / gallops. No extremity edema. 2+ pedal pulses. No carotid bruits.  Abdomen: no tenderness, no masses palpated. No hepatosplenomegaly. Bowel sounds positive.  Musculoskeletal: no clubbing / cyanosis. No joint deformity upper and lower extremities. Good ROM, no contractures. Normal muscle tone.  Skin: no rashes, lesions, ulcers. No induration Neurologic: CN 2-12 grossly intact. Sensation intact, DTR normal. Strength 5/5 in all 4.  Psychiatric: Poor judgment and insight. Alert and oriented x 3. Normal mood.   Labs on Admission: I have personally reviewed following labs and imaging studies  CBC: Recent Labs  Lab 08/31/23 1042  WBC 6.0  HGB 13.4  HCT 37.7*  MCV 98.7  PLT 210   Basic Metabolic Panel: Recent Labs  Lab 08/31/23 1042 08/31/23 1211  NA 132*  --   K 2.2*  --   CL 78*  --   CO2 34*  --   GLUCOSE 118*  --   BUN 9  --   CREATININE 0.95  --   CALCIUM 8.6*  --   MG  --  1.7   GFR: Estimated Creatinine Clearance: 93.3 mL/min (by C-G formula based on SCr of 0.95 mg/dL). Liver Function Tests: Recent Labs  Lab 08/31/23 1211  AST 26  ALT 9  ALKPHOS 100  BILITOT 1.2  PROT 6.7  ALBUMIN 3.6   Recent Labs  Lab 08/31/23 1211  LIPASE 35   No results for input(s): AMMONIA in the last 168 hours. Coagulation Profile: No results for input(s): INR, PROTIME in the last 168  hours. Cardiac Enzymes: No results for input(s): CKTOTAL, CKMB, CKMBINDEX, TROPONINI in the last 168 hours. BNP (last 3 results) No results for input(s): PROBNP in the last 8760 hours. HbA1C: No results for input(s): HGBA1C in the last 72 hours. CBG: No results for input(s): GLUCAP in the last 168 hours. Lipid Profile: No results for input(s): CHOL, HDL, LDLCALC, TRIG, CHOLHDL, LDLDIRECT in the last 72 hours. Thyroid Function Tests: No results for input(s): TSH, T4TOTAL, FREET4, T3FREE, THYROIDAB in the last 72 hours. Anemia Panel: No results for input(s): VITAMINB12, FOLATE, FERRITIN, TIBC, IRON, RETICCTPCT in the last 72 hours. Urine analysis:    Component Value Date/Time   COLORURINE YELLOW  09/01/2010 1626   APPEARANCEUR CLEAR 09/01/2010 1626   LABSPEC 1.015 09/01/2010 1626   PHURINE 8.5 (H) 09/01/2010 1626   GLUCOSEU NEGATIVE 09/01/2010 1626   HGBUR NEGATIVE 09/01/2010 1626   BILIRUBINUR NEGATIVE 09/01/2010 1626   KETONESUR NEGATIVE 09/01/2010 1626   PROTEINUR NEGATIVE 09/01/2010 1626   UROBILINOGEN 1.0 09/01/2010 1626   NITRITE NEGATIVE 09/01/2010 1626   LEUKOCYTESUR NEGATIVE 09/01/2010 1626    Radiological Exams on Admission: CT Angio Chest/Abd/Pel for Dissection W and/or Wo Contrast Result Date: 08/31/2023 CLINICAL DATA:  Acute aortic syndrome (AAS) suspected. Chest pain. Right upper quadrant abdominal pain. Shortness of breath. EXAM: CT ANGIOGRAPHY CHEST, ABDOMEN AND PELVIS TECHNIQUE: Non-contrast CT of the chest was initially obtained. Multidetector CT imaging through the chest, abdomen and pelvis was performed using the standard protocol during bolus administration of intravenous contrast. Multiplanar reconstructed images and MIPs were obtained and reviewed to evaluate the vascular anatomy. RADIATION DOSE REDUCTION: This exam was performed according to the departmental dose-optimization program which includes automated  exposure control, adjustment of the mA and/or kV according to patient size and/or use of iterative reconstruction technique. CONTRAST:  OMNIPAQUE  IOHEXOL  350 MG/ML SOLN COMPARISON:  CT scan abdomen and pelvis from 11/21/2016. CT angiography chest report from 02/18/2019; however, please note, images are not available for review. FINDINGS: CTA CHEST FINDINGS Cardiovascular: No intramural hematoma noted in the thoracic aorta on the unenhanced images. Thoracic aorta is normal in caliber without aneurysm, dissection, vasculitis or significant stenosis. Normal cardiac size. No pericardial effusion. No aortic aneurysm. There are coronary artery calcifications, in keeping with coronary artery disease. There are also mild peripheral atherosclerotic vascular calcifications of thoracic aorta and its major branches. Mediastinum/Nodes: Visualized thyroid gland appears grossly unremarkable. No solid / cystic mediastinal masses. The esophagus is nondistended precluding optimal assessment. There is moderate circumferential thickening of the lower thoracic esophagus, which is most likely seen in the settings of chronic gastroesophageal reflux disease versus esophagitis. No axillary, mediastinal or hilar lymphadenopathy by size criteria. Lungs/Pleura: The central tracheo-bronchial tree is patent. No mass or consolidation. No pleural effusion or pneumothorax. No suspicious lung nodules. Musculoskeletal: The visualized soft tissues of the chest wall are grossly unremarkable. No suspicious osseous lesions. There are mild multilevel degenerative changes in the visualized spine. Review of the MIP images confirms the above findings. CTA ABDOMEN AND PELVIS FINDINGS VASCULAR Aorta: Normal caliber aorta without aneurysm, dissection, vasculitis or significant stenosis. There are moderate peripheral atherosclerotic vascular calcifications of the aorta and its major branches. Celiac: Diminutive. Replaced right hepatic artery arising from  the superior mesenteric artery. Celiac artery is otherwise patent without evidence of aneurysm, dissection, vasculitis or significant stenosis. SMA: Patent without evidence of aneurysm, dissection, vasculitis or significant stenosis. Renals: Both renal arteries are patent without evidence of aneurysm, dissection, vasculitis, fibromuscular dysplasia or significant stenosis. IMA: Patent without evidence of aneurysm, dissection, vasculitis or significant stenosis. Inflow: Patent without evidence of aneurysm, dissection, vasculitis or significant stenosis. Veins: No obvious venous abnormality within the limitations of this arterial phase study. Review of the MIP images confirms the above findings. NON-VASCULAR Hepatobiliary: The liver is normal in size. Non-cirrhotic configuration. No suspicious mass. There is a peripheral/subcapsular hyperattenuating area in the right hepatic lobe, segment 6 measuring 9 x 13 mm with surrounding hyperattenuating halo, which may represent small hemangioma with arterial portal shunting. No intrahepatic or extrahepatic bile duct dilation. No calcified gallstones. Normal gallbladder wall thickness. No pericholecystic inflammatory changes. Pancreas: Unremarkable. No pancreatic ductal dilatation or surrounding inflammatory changes.  Spleen: Within normal limits. No focal lesion. Adrenals/Urinary Tract: Adrenal glands are unremarkable. No suspicious renal mass. No hydronephrosis. No renal or ureteric calculi. Unremarkable urinary bladder. Stomach/Bowel: There is moderate sliding hiatal hernia. No disproportionate dilation of the small or large bowel loops. No evidence of abnormal bowel wall thickening or inflammatory changes. The appendix is unremarkable. Vascular/Lymphatic: No ascites or pneumoperitoneum. No abdominal or pelvic lymphadenopathy, by size criteria. Reproductive: Normal size prostate. Symmetric seminal vesicles. Other: The visualized soft tissues and abdominal wall are  unremarkable. Musculoskeletal: No suspicious osseous lesions. There are mild multilevel degenerative changes in the visualized spine. Review of the MIP images confirms the above findings. IMPRESSION: 1. No acute aortic syndrome. No intramural hematoma in the thoracic aorta. No thoracoabdominal aortic aneurysm, dissection, vasculitis or penetrating atherosclerotic ulcer. 2. No acute inflammatory process identified within the abdomen or pelvis. 3. There is a 9 x 13 mm hyperattenuating area in the right hepatic lobe, segment 6, which is favored to represent a hemangioma. 4. Multiple other nonacute observations, as described above. Aortic Atherosclerosis (ICD10-I70.0). Electronically Signed   By: Ree Molt M.D.   On: 08/31/2023 13:53   CT Head Wo Contrast Result Date: 08/31/2023 CLINICAL DATA:  Head trauma, repeat vomiting (Age 82-64y) Headache, new onset (Age >= 51y). EXAM: CT HEAD WITHOUT CONTRAST TECHNIQUE: Contiguous axial images were obtained from the base of the skull through the vertex without intravenous contrast. RADIATION DOSE REDUCTION: This exam was performed according to the departmental dose-optimization program which includes automated exposure control, adjustment of the mA and/or kV according to patient size and/or use of iterative reconstruction technique. COMPARISON:  CT scan head from 02/26/2022. FINDINGS: Brain: No evidence of acute infarction, hemorrhage, hydrocephalus, extra-axial collection or mass lesion/mass effect. There is bilateral periventricular hypodensity, which is non-specific but most likely seen in the settings of microvascular ischemic changes. Minimal in extent. Otherwise normal appearance of brain parenchyma. Ventricles are normal. Cerebral volume is age appropriate. Vascular: No hyperdense vessel or unexpected calcification. Intracranial arteriosclerosis. Skull: Normal. Negative for fracture or focal lesion. Sinuses/Orbits: No acute finding. Other: Visualized mastoid air  cells are unremarkable. No mastoid effusion. IMPRESSION: *No acute intracranial abnormality. Electronically Signed   By: Ree Molt M.D.   On: 08/31/2023 13:43   DG Chest 2 View Result Date: 08/31/2023 CLINICAL DATA:  chest pain. EXAM: CHEST - 2 VIEW COMPARISON:  None Available. FINDINGS: Bilateral lung fields are clear. Bilateral costophrenic angles are clear. Normal cardio-mediastinal silhouette. No acute osseous abnormalities. The soft tissues are within normal limits. IMPRESSION: No active cardiopulmonary disease. Electronically Signed   By: Ree Molt M.D.   On: 08/31/2023 10:53    EKG: Independently reviewed. Sinus tachycardia   Assessment/Plan Principal Problem:   Intractable nausea and vomiting Active Problems:   Alcohol withdrawal (HCC)   Chronic alcohol abuse   Atypical chest pain   GERD (gastroesophageal reflux disease)   Barrett's esophagus without dysplasia   Reflux esophagitis   Epilepsy (HCC)   Tobacco abuse   Essential hypertension   Depression   Hypokalemia   Hypomagnesemia   Macrocytic anemia   Hyponatremia   Lactic acidosis    Alcohol withdrawal -- Pt reports last alcoholic drink was on 08/31/23 -- Pt reports intractable nausea and vomiting -- he was sent from [facility] outpatient rehab  -- he is being admitted to stepdown ICU -- alcohol withdrawal protocol ordered -- scheduled librium  taper ordered to try to avoid delirium tremens -- IV fluids ordered -- replete electrolytes as needed  and follow them closely  -- Urine toxicology screen ordered   Hyponatremia -- secondary to dehydration  -- IV fluids ordered to rehydrate  -- secondary to fluid losses   Hypokalemia  -- IV potassium replacement ordered -- IV magnesium  ordered -- follow closely   Chest Pain  -- suspect secondary to gastritis and esophagitis -- IV pantoprazole  ordered for GI protection given his history of Barrett's esophagus -- symptom management  -- ACS ruled out given  reassuring high sensitive troponin tests  Essential Hypertension  -- resume home meds  Lactic acidosis -- treating with IV fluid hydration  -- follow to resolution  Tobacco -- nicotine  patch ordered for nicotine  cravings  Epilepsy -- resume home antiepileptics when home meds have been reconciled  -- seizure precautions, especially in setting of alcohol withdrawal   DVT prophylaxis: enoxaparin    Code Status: Full   Family Communication:   Disposition Plan: hopefully will be able to return to [facility] when medically stabilized for alcoholism treatment   Consults called:   Admission status: INP Critical Care Procedure Note Authorized and Performed by: KYM Louder MD  Total Critical Care time:  57 mins Due to a high probability of clinically significant, life threatening deterioration, the patient required my highest level of preparedness to intervene emergently and I personally spent this critical care time directly and personally managing the patient.  This critical care time included obtaining a history; examining the patient, pulse oximetry; ordering and review of studies; arranging urgent treatment with development of a management plan; evaluation of patient's response of treatment; frequent reassessment; and discussions with other providers.  This critical care time was performed to assess and manage the high probability of imminent and life threatening deterioration that could result in multi-organ failure.  It was exclusive of separately billable procedures and treating other patients and teaching time.    Level of care: Stepdown Afton Louder MD [facility] How to contact the [facility] Attending or Consulting provider 7A - 7P or covering provider during after hours 7P -7A, for this patient?  Check the care team in [facility] and look for a) attending/consulting [facility] provider listed and b) the [facility] team listed Log into [email or web address] and use [facility] universal password to access. If  you do not have the password, please contact the hospital operator. Locate the [facility] provider you are looking for under [facility] and page to a number that you can be directly reached. If you still have difficulty reaching the provider, please page the [facility] (Director on Call) for the Hospitalists listed on amion for assistance.   If 7PM-7AM, please contact night-coverage [email or web address] Password [email or web address]  08/31/2023, 3:07 PM

## 2023-08-31 NOTE — ED Provider Notes (Signed)
 [facility] EMERGENCY DEPARTMENT AT [facility] Provider Note  CSN: [id number] Arrival date & time: 08/31/23 1015  Chief Complaint(s) Chest Pain  HPI Jonathon Guerrero is a 53 y.o. male history of alcohol abuse, esophagitis, seizure disorder presenting to the emergency department with chest pain.  Patient reports chest pain and abdominal pain.  Began last night.  Also reports some shortness of breath.  Went to alcohol rehab facility.  Reports he has been having vomiting.  Also reports falling this morning and hitting his head.  Denies any fevers or chills.  Denies any coughs.  Reports he is having frequent hiccups.  No diarrhea.   Past Medical History Past Medical History:  Diagnosis Date   Barrett's esophagus without dysplasia    GERD (gastroesophageal reflux disease)    Helicobacter pylori gastritis 09/2010   completed Prevpac   Hypertension    Reflux esophagitis    severe erosive RE on EGD 09/2010   Seizures [facility])    Patient Active Problem List   Diagnosis Date Noted   Intractable nausea and vomiting 08/31/2023   Hyponatremia 08/31/2023   Lactic acidosis 08/31/2023   Atypical chest pain 08/31/2023   Chronic alcohol abuse 02/27/2022   Hypokalemia 02/27/2022   Hypomagnesemia 02/27/2022   Macrocytic anemia 02/27/2022   Allergic rhinitis 02/27/2022   Alcohol withdrawal (HCC) 02/26/2022   Epilepsy (HCC) 06/29/2021   Tobacco abuse 06/29/2021   Essential hypertension 06/29/2021   Depression 06/29/2021   Barrett's esophagus without dysplasia 09/22/2010   Reflux esophagitis 09/22/2010   GERD (gastroesophageal reflux disease) 09/07/2010   Home Medication(s) Prior to Admission medications   Medication Sig Start Date End Date Taking? Authorizing Provider  levETIRAcetam (KEPPRA) 500 MG tablet Take 500 mg by mouth 2 (two) times daily. 08/04/23  Yes [provider]  albuterol  (VENTOLIN  HFA) 108 (90 Base) MCG/ACT inhaler Inhale 2 puffs into the lungs every 6 (six) hours  as needed for wheezing or shortness of breath. 06/29/21   Zarwolo, Gloria, FNP  amLODipine  (NORVASC ) 5 MG tablet Take 1 tablet (5 mg total) by mouth daily. 06/29/21   Zarwolo, Gloria, FNP  citalopram  (CELEXA ) 20 MG tablet Take 1 tablet (20 mg total) by mouth daily. 06/29/21   Zarwolo, Gloria, FNP  fluticasone  (FLONASE ) 50 MCG/ACT nasal spray Place 2 sprays into both nostrils daily. 06/29/21   Zarwolo, Gloria, FNP  gabapentin  (NEURONTIN ) 100 MG capsule Take 1 capsule (100 mg total) by mouth 3 (three) times daily. 03/01/22 03/31/22  Arlice Reichert, MD  HYDROcodone -acetaminophen  (NORCO/VICODIN) 5-325 MG tablet Take 1-2 tablets every 6 (six) hours as needed by mouth. Patient not taking: Reported on 06/29/2021 11/21/16   Liu, Dana Duo, MD  lisinopril  (PRINIVIL ,ZESTRIL ) 10 MG tablet Take 1 tablet (10 mg total) by mouth daily. 09/29/16   Comer Kirsch, PA-C  loratadine  (CLARITIN ) 10 MG tablet Take 10 mg by mouth daily. 04/04/23   [provider]  omeprazole  (PRILOSEC) 40 MG capsule Take 1 capsule (40 mg total) by mouth daily. 06/29/21   Zarwolo, Gloria, FNP  Past Surgical History Past Surgical History:  Procedure Laterality Date   COLONOSCOPY  09/2010   hemorrhoids, small hepatic flexure polyp (ablated)   ESOPHAGOGASTRODUODENOSCOPY  09/2010   severe erosive RE, Barrett's, h.pylori gastritis   POLYPECTOMY  09/09/2010   Dr Shaaron   Family History Family History  Problem Relation Age of Onset   Lung cancer Father 2   Ulcers Father    Hypertension Father    Aneurysm Mother 62       Brain Aneurysm   Hypertension Brother    Seizures Son    Anesthesia problems Neg Hx    Hypotension Neg Hx    Malignant hyperthermia Neg Hx    Pseudochol deficiency Neg Hx     Social History Social History   Tobacco Use   Smoking status: Every Day    Current packs/day: 0.75    Average  packs/day: 0.8 packs/day for 9.0 years (6.8 ttl pk-yrs)    Types: Cigarettes   Smokeless tobacco: Never  Vaping Use   Vaping status: Never Used  Substance Use Topics   Alcohol use: Yes    Comment: a pint of liquor every other day   Drug use: Yes    Types: Marijuana    Comment: about once per month per client   Allergies Poison sumac extract, Sardine flavor, and Shellfish allergy  Review of Systems Review of Systems  All other systems reviewed and are negative.   Physical Exam Vital Signs  I have reviewed the triage vital signs BP 126/69   Pulse 79   Temp 98.8 F (37.1 C) (Oral)   Resp (!) 21   Ht 5' 7 (1.702 m)   Wt 80.2 kg   SpO2 93%   BMI 27.69 kg/m  Physical Exam Vitals and nursing note reviewed.  Constitutional:      General: He is not in acute distress.    Appearance: Normal appearance.  HENT:     Mouth/Throat:     Mouth: Mucous membranes are moist.  Eyes:     Conjunctiva/sclera: Conjunctivae normal.  Cardiovascular:     Rate and Rhythm: Normal rate and regular rhythm.  Pulmonary:     Effort: Pulmonary effort is normal. Tachypnea present. No respiratory distress.     Breath sounds: Normal breath sounds.  Abdominal:     General: Abdomen is flat.     Palpations: Abdomen is soft.     Tenderness: There is abdominal tenderness (RUQ).  Musculoskeletal:     Right lower leg: No edema.     Left lower leg: No edema.  Skin:    General: Skin is warm and dry.     Capillary Refill: Capillary refill takes less than 2 seconds.  Neurological:     Mental Status: He is alert and oriented to person, place, and time. Mental status is at baseline.  Psychiatric:        Mood and Affect: Mood normal.        Behavior: Behavior normal.     ED Results and Treatments Labs (all labs ordered are listed, but only abnormal results are displayed) Labs Reviewed  BASIC METABOLIC PANEL WITH GFR - Abnormal; Notable for the following components:      Result Value   Sodium 132  (*)    Potassium 2.2 (*)    Chloride 78 (*)    CO2 34 (*)    Glucose, Bld 118 (*)    Calcium 8.6 (*)    Anion gap 20 (*)    All  other components within normal limits  CBC - Abnormal; Notable for the following components:   RBC 3.82 (*)    HCT 37.7 (*)    MCH 35.1 (*)    All other components within normal limits  LACTIC ACID, PLASMA - Abnormal; Notable for the following components:   Lactic Acid, Venous 2.1 (*)    All other components within normal limits  HEPATIC FUNCTION PANEL - Abnormal; Notable for the following components:   Indirect Bilirubin 1.0 (*)    All other components within normal limits  MAGNESIUM   BETA-HYDROXYBUTYRIC ACID  LIPASE, BLOOD  LACTIC ACID, PLASMA  ETHANOL  RAPID URINE DRUG SCREEN, HOSP PERFORMED  TROPONIN I (HIGH SENSITIVITY)  TROPONIN I (HIGH SENSITIVITY)                                                                                                                          Radiology CT Angio Chest/Abd/Pel for Dissection W and/or Wo Contrast Result Date: 08/31/2023 CLINICAL DATA:  Acute aortic syndrome (AAS) suspected. Chest pain. Right upper quadrant abdominal pain. Shortness of breath. EXAM: CT ANGIOGRAPHY CHEST, ABDOMEN AND PELVIS TECHNIQUE: Non-contrast CT of the chest was initially obtained. Multidetector CT imaging through the chest, abdomen and pelvis was performed using the standard protocol during bolus administration of intravenous contrast. Multiplanar reconstructed images and MIPs were obtained and reviewed to evaluate the vascular anatomy. RADIATION DOSE REDUCTION: This exam was performed according to the departmental dose-optimization program which includes automated exposure control, adjustment of the mA and/or kV according to patient size and/or use of iterative reconstruction technique. CONTRAST:  OMNIPAQUE  IOHEXOL  350 MG/ML SOLN COMPARISON:  CT scan abdomen and pelvis from 11/21/2016. CT angiography chest report from 02/18/2019;  however, please note, images are not available for review. FINDINGS: CTA CHEST FINDINGS Cardiovascular: No intramural hematoma noted in the thoracic aorta on the unenhanced images. Thoracic aorta is normal in caliber without aneurysm, dissection, vasculitis or significant stenosis. Normal cardiac size. No pericardial effusion. No aortic aneurysm. There are coronary artery calcifications, in keeping with coronary artery disease. There are also mild peripheral atherosclerotic vascular calcifications of thoracic aorta and its major branches. Mediastinum/Nodes: Visualized thyroid gland appears grossly unremarkable. No solid / cystic mediastinal masses. The esophagus is nondistended precluding optimal assessment. There is moderate circumferential thickening of the lower thoracic esophagus, which is most likely seen in the settings of chronic gastroesophageal reflux disease versus esophagitis. No axillary, mediastinal or hilar lymphadenopathy by size criteria. Lungs/Pleura: The central tracheo-bronchial tree is patent. No mass or consolidation. No pleural effusion or pneumothorax. No suspicious lung nodules. Musculoskeletal: The visualized soft tissues of the chest wall are grossly unremarkable. No suspicious osseous lesions. There are mild multilevel degenerative changes in the visualized spine. Review of the MIP images confirms the above findings. CTA ABDOMEN AND PELVIS FINDINGS VASCULAR Aorta: Normal caliber aorta without aneurysm, dissection, vasculitis or significant stenosis. There are moderate peripheral atherosclerotic vascular calcifications of the aorta and its major branches. Celiac:  Diminutive. Replaced right hepatic artery arising from the superior mesenteric artery. Celiac artery is otherwise patent without evidence of aneurysm, dissection, vasculitis or significant stenosis. SMA: Patent without evidence of aneurysm, dissection, vasculitis or significant stenosis. Renals: Both renal arteries are patent  without evidence of aneurysm, dissection, vasculitis, fibromuscular dysplasia or significant stenosis. IMA: Patent without evidence of aneurysm, dissection, vasculitis or significant stenosis. Inflow: Patent without evidence of aneurysm, dissection, vasculitis or significant stenosis. Veins: No obvious venous abnormality within the limitations of this arterial phase study. Review of the MIP images confirms the above findings. NON-VASCULAR Hepatobiliary: The liver is normal in size. Non-cirrhotic configuration. No suspicious mass. There is a peripheral/subcapsular hyperattenuating area in the right hepatic lobe, segment 6 measuring 9 x 13 mm with surrounding hyperattenuating halo, which may represent small hemangioma with arterial portal shunting. No intrahepatic or extrahepatic bile duct dilation. No calcified gallstones. Normal gallbladder wall thickness. No pericholecystic inflammatory changes. Pancreas: Unremarkable. No pancreatic ductal dilatation or surrounding inflammatory changes. Spleen: Within normal limits. No focal lesion. Adrenals/Urinary Tract: Adrenal glands are unremarkable. No suspicious renal mass. No hydronephrosis. No renal or ureteric calculi. Unremarkable urinary bladder. Stomach/Bowel: There is moderate sliding hiatal hernia. No disproportionate dilation of the small or large bowel loops. No evidence of abnormal bowel wall thickening or inflammatory changes. The appendix is unremarkable. Vascular/Lymphatic: No ascites or pneumoperitoneum. No abdominal or pelvic lymphadenopathy, by size criteria. Reproductive: Normal size prostate. Symmetric seminal vesicles. Other: The visualized soft tissues and abdominal wall are unremarkable. Musculoskeletal: No suspicious osseous lesions. There are mild multilevel degenerative changes in the visualized spine. Review of the MIP images confirms the above findings. IMPRESSION: 1. No acute aortic syndrome. No intramural hematoma in the thoracic aorta. No  thoracoabdominal aortic aneurysm, dissection, vasculitis or penetrating atherosclerotic ulcer. 2. No acute inflammatory process identified within the abdomen or pelvis. 3. There is a 9 x 13 mm hyperattenuating area in the right hepatic lobe, segment 6, which is favored to represent a hemangioma. 4. Multiple other nonacute observations, as described above. Aortic Atherosclerosis (ICD10-I70.0). Electronically Signed   By: Ree Molt M.D.   On: 08/31/2023 13:53   CT Head Wo Contrast Result Date: 08/31/2023 CLINICAL DATA:  Head trauma, repeat vomiting (Age 60-64y) Headache, new onset (Age >= 51y). EXAM: CT HEAD WITHOUT CONTRAST TECHNIQUE: Contiguous axial images were obtained from the base of the skull through the vertex without intravenous contrast. RADIATION DOSE REDUCTION: This exam was performed according to the departmental dose-optimization program which includes automated exposure control, adjustment of the mA and/or kV according to patient size and/or use of iterative reconstruction technique. COMPARISON:  CT scan head from 02/26/2022. FINDINGS: Brain: No evidence of acute infarction, hemorrhage, hydrocephalus, extra-axial collection or mass lesion/mass effect. There is bilateral periventricular hypodensity, which is non-specific but most likely seen in the settings of microvascular ischemic changes. Minimal in extent. Otherwise normal appearance of brain parenchyma. Ventricles are normal. Cerebral volume is age appropriate. Vascular: No hyperdense vessel or unexpected calcification. Intracranial arteriosclerosis. Skull: Normal. Negative for fracture or focal lesion. Sinuses/Orbits: No acute finding. Other: Visualized mastoid air cells are unremarkable. No mastoid effusion. IMPRESSION: *No acute intracranial abnormality. Electronically Signed   By: Ree Molt M.D.   On: 08/31/2023 13:43   DG Chest 2 View Result Date: 08/31/2023 CLINICAL DATA:  chest pain. EXAM: CHEST - 2 VIEW COMPARISON:  None  Available. FINDINGS: Bilateral lung fields are clear. Bilateral costophrenic angles are clear. Normal cardio-mediastinal silhouette. No acute osseous abnormalities. The soft tissues  are within normal limits. IMPRESSION: No active cardiopulmonary disease. Electronically Signed   By: Ree Molt M.D.   On: 08/31/2023 10:53    Pertinent labs & imaging results that were available during my care of the patient were reviewed by me and considered in my medical decision making (see MDM for details).  Medications Ordered in ED Medications  potassium chloride  10 mEq in 100 mL IVPB (10 mEq Intravenous New Bag/Given 08/31/23 1327)  magnesium  sulfate IVPB 2 g 50 mL (has no administration in time range)  ondansetron  (ZOFRAN ) injection 4 mg (4 mg Intravenous Given 08/31/23 1039)  lactated ringers  bolus 1,000 mL (0 mLs Intravenous Stopped 08/31/23 1300)  magnesium  sulfate IVPB 2 g 50 mL (0 g Intravenous Stopped 08/31/23 1312)  metoCLOPramide  (REGLAN ) injection 10 mg (10 mg Intravenous Given 08/31/23 1202)  morphine  (PF) 4 MG/ML injection 4 mg (4 mg Intravenous Given 08/31/23 1202)  iohexol  (OMNIPAQUE ) 350 MG/ML injection 100 mL (100 mLs Intravenous Contrast Given 08/31/23 1305)                                                                                                                                     Procedures .Critical Care  Performed by: Francesca Elsie CROME, MD Authorized by: Francesca Elsie CROME, MD   Critical care provider statement:    Critical care time (minutes):  30   Critical care was necessary to treat or prevent imminent or life-threatening deterioration of the following conditions:  Metabolic crisis   Critical care was time spent personally by me on the following activities:  Development of treatment plan with patient or surrogate, discussions with consultants, evaluation of patient's response to treatment, examination of patient, ordering and review of laboratory studies, ordering and review  of radiographic studies, ordering and performing treatments and interventions, pulse oximetry, re-evaluation of patient's condition and review of old charts   Care discussed with: admitting provider     (including critical care time)  Medical Decision Making / ED Course   MDM:  53 year old presenting to the emergency department with chest pain, abdominal pain, vomiting.  Patient mildly tachypneic, otherwise well-appearing.  Does have some right upper quadrant tenderness.  Metabolic testing notable for hypokalemia, anion gap, elevated CO2, low chloride.  Suspect patient has been vomiting.  Has had prior hospitalizations for hypokalemia.  Likely related to esophagitis and alcohol abuse.  Does not seem to be in withdrawal currently.  Will also check troponin given chest pain.  Given chest abdominal pain, vomiting and hiccups, will also check CT scan to rule out dissection, other intrathoracic or intra-abdominal process although lower concern for this, think that it would be important to check given degree of symptoms.  Differential also includes starvation ketosis given anion gap so we will check beta-hydroxybutyrate, also check lactic acid level.  Will replete potassium, give IV fluids, check magnesium  level also.  Anticipate patient will need admission given his metabolic derangements  even if no underlying dangerous causes identified.  Clinical Course as of 08/31/23 1428  Thu Aug 31, 2023  1427 Imaging without evidence of acute process.  Beta-hydroxybutyrate levels normal.  Lactic acid level is somewhat elevated.  Will give additional fluids.  Patient will need admission for further care. Discussed with Dr. Vicci  [WS]    Clinical Course User Index [WS] Francesca Elsie CROME, MD     Additional history obtained: -Additional history obtained from ems -External records from outside source obtained and reviewed including: Chart review including previous notes, labs, imaging, consultation notes  including prior notes    Lab Tests: -I ordered, reviewed, and interpreted labs.   The pertinent results include:   Labs Reviewed  BASIC METABOLIC PANEL WITH GFR - Abnormal; Notable for the following components:      Result Value   Sodium 132 (*)    Potassium 2.2 (*)    Chloride 78 (*)    CO2 34 (*)    Glucose, Bld 118 (*)    Calcium 8.6 (*)    Anion gap 20 (*)    All other components within normal limits  CBC - Abnormal; Notable for the following components:   RBC 3.82 (*)    HCT 37.7 (*)    MCH 35.1 (*)    All other components within normal limits  LACTIC ACID, PLASMA - Abnormal; Notable for the following components:   Lactic Acid, Venous 2.1 (*)    All other components within normal limits  HEPATIC FUNCTION PANEL - Abnormal; Notable for the following components:   Indirect Bilirubin 1.0 (*)    All other components within normal limits  MAGNESIUM   BETA-HYDROXYBUTYRIC ACID  LIPASE, BLOOD  LACTIC ACID, PLASMA  ETHANOL  RAPID URINE DRUG SCREEN, HOSP PERFORMED  TROPONIN I (HIGH SENSITIVITY)  TROPONIN I (HIGH SENSITIVITY)    Notable for see MDM  EKG   EKG Interpretation Date/Time:  Thursday August 31 2023 10:39:18 EDT Ventricular Rate:  103 PR Interval:  144 QRS Duration:  106 QT Interval:  421 QTC Calculation: 552 R Axis:   90  Text Interpretation: Sinus tachycardia Borderline right axis deviation Low voltage, extremity leads Prolonged QT interval Confirmed by Francesca Elsie ([id number]) on 08/31/2023 11:54:27 AM         Imaging Studies ordered: I ordered imaging studies including CTA C/A/P, CT head  On my interpretation imaging demonstrates no acute process I independently visualized and interpreted imaging. I agree with the radiologist interpretation   Medicines ordered and prescription drug management: Meds ordered this encounter  Medications   ondansetron  (ZOFRAN ) injection 4 mg   ondansetron  (ZOFRAN ) 4 MG/2ML injection    Celestia, Christy M: cabinet  override   lactated ringers  bolus 1,000 mL   magnesium  sulfate IVPB 2 g 50 mL   potassium chloride  10 mEq in 100 mL IVPB   metoCLOPramide  (REGLAN ) injection 10 mg   morphine  (PF) 4 MG/ML injection 4 mg    Refill:  0   iohexol  (OMNIPAQUE ) 350 MG/ML injection 100 mL   magnesium  sulfate IVPB 2 g 50 mL    -I have reviewed the patients home medicines and have made adjustments as needed   Consultations Obtained: I requested consultation with the hospitalist,  and discussed lab and imaging findings as well as pertinent plan - they recommend: admission   Cardiac Monitoring: The patient was maintained on a cardiac monitor.  I personally viewed and interpreted the cardiac monitored which showed an underlying rhythm of: NSR  Social Determinants of Health:  Diagnosis or treatment significantly limited by social determinants of health: alcohol use   Reevaluation: After the interventions noted above, I reevaluated the patient and found that their symptoms have improved  Co morbidities that complicate the patient evaluation  Past Medical History:  Diagnosis Date   Barrett's esophagus without dysplasia    GERD (gastroesophageal reflux disease)    Helicobacter pylori gastritis 09/2010   completed Prevpac   Hypertension    Reflux esophagitis    severe erosive RE on EGD 09/2010   Seizures (HCC)       Dispostion: Disposition decision including need for hospitalization was considered, and patient admitted to the hospital.    Final Clinical Impression(s) / ED Diagnoses Final diagnoses:  Hypokalemia  Vomiting, unspecified vomiting type, unspecified whether nausea present     This chart was dictated using voice recognition software.  Despite best efforts to proofread,  errors can occur which can change the documentation meaning.    Francesca Elsie CROME, MD 08/31/23 [phone]

## 2023-08-31 NOTE — ED Triage Notes (Signed)
 Pt BIB ems for chest pain, RUQ abdominal pain, and SHOB starting last night. Pt was at [facility] trying to get detox from alcohol treatment. Pt had one 40 yesterday. Pt seems to struggle with giving complete sentences during triage.

## 2023-09-01 DIAGNOSIS — E872 Acidosis, unspecified: Secondary | ICD-10-CM

## 2023-09-01 DIAGNOSIS — R0789 Other chest pain: Secondary | ICD-10-CM

## 2023-09-01 LAB — BASIC METABOLIC PANEL WITH GFR
Anion gap: 13 (ref 5–15)
Anion gap: 14 (ref 5–15)
BUN: 10 mg/dL (ref 6–20)
BUN: 9 mg/dL (ref 6–20)
CO2: 35 mmol/L — ABNORMAL HIGH (ref 22–32)
CO2: 31 mmol/L (ref 22–32)
Calcium: 8.5 mg/dL — ABNORMAL LOW (ref 8.9–10.3)
Calcium: 8.4 mg/dL — ABNORMAL LOW (ref 8.9–10.3)
Chloride: 88 mmol/L — ABNORMAL LOW (ref 98–111)
Chloride: 92 mmol/L — ABNORMAL LOW (ref 98–111)
Creatinine, Ser: 0.9 mg/dL (ref 0.61–1.24)
Creatinine, Ser: 1.05 mg/dL (ref 0.61–1.24)
GFR, Estimated: >60 mL/min (ref >60)
GFR, Estimated: >60 mL/min (ref >60)
Glucose, Bld: 99 mg/dL (ref 70–99)
Glucose, Bld: 144 mg/dL — ABNORMAL HIGH (ref 70–99)
Potassium: 2.8 mmol/L — ABNORMAL LOW (ref 3.5–5.1)
Potassium: 3.1 mmol/L — ABNORMAL LOW (ref 3.5–5.1)
Sodium: 136 mmol/L (ref 135–145)
Sodium: 137 mmol/L (ref 135–145)

## 2023-09-01 LAB — PHOSPHORUS: Phosphorus: 3.9 mg/dL (ref 2.5–4.6)

## 2023-09-01 LAB — HIV ANTIBODY (ROUTINE TESTING W REFLEX): HIV Screen 4th Generation wRfx: NONREACTIVE

## 2023-09-01 LAB — MAGNESIUM: Magnesium: 2.2 mg/dL (ref 1.7–2.4)

## 2023-09-01 MED ORDER — POTASSIUM CHLORIDE 2 MEQ/ML IV SOLN
INTRAVENOUS | Status: DC
  Filled 2023-09-01 (×3): qty 1000

## 2023-09-01 MED ORDER — FENTANYL CITRATE PF 50 MCG/ML IJ SOSY: 25.0000 ug | PREFILLED_SYRINGE | INTRAMUSCULAR | Status: DC | PRN

## 2023-09-01 MED ORDER — LORATADINE 10 MG PO TABS
10.0000 mg | ORAL_TABLET | Freq: Every day | ORAL | Status: DC
  Administered 2023-09-01 – 2023-09-02 (×2): 10 mg via ORAL
  Filled 2023-09-01 (×2): qty 1

## 2023-09-01 MED ORDER — DIVALPROEX SODIUM ER 500 MG PO TB24
500.0000 mg | ORAL_TABLET | Freq: Two times a day (BID) | ORAL | Status: DC
  Administered 2023-09-01 – 2023-09-02 (×3): 500 mg via ORAL
  Filled 2023-09-01 (×3): qty 1

## 2023-09-01 MED ORDER — ENSURE PLUS HIGH PROTEIN PO LIQD
237.0000 mL | Freq: Three times a day (TID) | ORAL | Status: DC
  Administered 2023-09-01 – 2023-09-02 (×2): 237 mL via ORAL

## 2023-09-01 MED ORDER — POTASSIUM CHLORIDE CRYS ER 20 MEQ PO TBCR
20.0000 meq | EXTENDED_RELEASE_TABLET | Freq: Once | ORAL | Status: AC
  Administered 2023-09-01: 20 meq via ORAL
  Filled 2023-09-01: qty 1

## 2023-09-01 MED ORDER — POTASSIUM CHLORIDE 10 MEQ/100ML IV SOLN
10.0000 meq | INTRAVENOUS | Status: AC
  Administered 2023-09-01 (×4): 10 meq via INTRAVENOUS
  Filled 2023-09-01 (×5): qty 100

## 2023-09-01 MED ORDER — MELATONIN 3 MG PO TABS
3.0000 mg | ORAL_TABLET | Freq: Every day | ORAL | Status: DC
  Administered 2023-09-01: 3 mg via ORAL
  Filled 2023-09-01: qty 1

## 2023-09-01 MED ORDER — GABAPENTIN 100 MG PO CAPS
100.0000 mg | ORAL_CAPSULE | Freq: Three times a day (TID) | ORAL | Status: DC
  Administered 2023-09-01 – 2023-09-02 (×3): 100 mg via ORAL
  Filled 2023-09-01 (×3): qty 1

## 2023-09-01 NOTE — Plan of Care (Signed)

## 2023-09-01 NOTE — Progress Notes (Signed)
 Initial Nutrition Assessment  DOCUMENTATION CODES:   Not applicable  INTERVENTION:   Ensure Plus High Protein po TID, each supplement provides 350 kcal and 20 grams of protein MVI with minerals daily Continue thiamine  Continue to monitor and replete electrolytes  NUTRITION DIAGNOSIS:   Inadequate oral intake related to nausea, vomiting as evidenced by per patient/family report.  GOAL:   Patient will meet greater than or equal to 90% of their needs  MONITOR:   PO intake, Supplement acceptance  REASON FOR ASSESSMENT:   Malnutrition Screening Tool    ASSESSMENT:   53 yo male admitted with chest pain, alcohol withdrawal, intractable N/V. PMH includes reflux esophagitis, Barrett's esophagus, H pylori gastritis, HTN, seizures, tobacco, alcohol, and drug abuse.  Patient reoprts usual weight in the 190s, now down to 172 lbs. He reports losing weight over the past 2-3 weeks d/t decreased intake and vomiting everything he ate for the past few days. He is eating better now that he is in the hospital. He is homeless; suspect some degree of food insecurity. He agreed to drink Ensure supplements between meals to maximize protein and calorie intake.   Weight history reviewed. No significant weight changes noted over the past 1.5 years. In 2018, he weighed ~90-92 kg. 02/28/22 80.2 kg 08/31/23 78.3 kg 09/28/16 90.6 kg  Labs reviewed. K 2.8  Medications reviewed and include folic acid , MVI, thiamine . Received IV potassium chloride  for repletion this morning.   NUTRITION - FOCUSED PHYSICAL EXAM:  Unable to complete at this time  Diet Order:   Diet Order             Diet regular Fluid consistency: Thin  Diet effective now                   EDUCATION NEEDS:   Not appropriate for education at this time  Skin:  Skin Assessment: Reviewed RN Assessment  Last BM:  8/28  Height:   Ht Readings from Last 1 Encounters:  08/31/23 5' 7 (1.702 m)    Weight:   Wt Readings  from Last 1 Encounters:  08/31/23 78.3 kg    Ideal Body Weight:  67.3 kg  BMI:  Body mass index is 27.04 kg/m.  Estimated Nutritional Needs:   Kcal:  2100-2300  Protein:  100-120 gm  Fluid:  2.1-2.3 L   Suzen HUNT RD, LDN, CNSC Contact via secure chat. If unavailable, use group chat RD Inpatient.

## 2023-09-01 NOTE — Progress Notes (Addendum)
 PROGRESS NOTE   MCKALE HAFFEY  FMW:[id number] DOB: June 20, 1970 DOA: 08/31/2023 PCP: Pcp, No   Chief Complaint  Patient presents with   Chest Pain   Level of care: Med-Surg  Brief Admission History:  53 year old male chronic heavy alcohol abuser, seizure disorder, GERD, Barrett's esophagus, h/o helicobacter gastritis, HTN, reported his last alcoholic drink was yesterday and he presented to the [facility] facility for alcohol withdrawal treatment.  Unfortunately he was experiencing severe nausea and vomiting and ataxia associated with frequent falling down and he was sent from [facility] to the ED.  He reports that he hit his head with a fall. He his having frequent hiccups and he is having chest discomfort and pain since he has been vomiting so frequently.  He denies having diarrhea. He was seen in ED today and noted to have hyponatremia, hypokalemia and low chloride levels consistent with his history of severe emesis.  He was given treatment in the ED and continued to be symptomatic and admission was requested for further management.     Assessment and Plan:  Alcohol withdrawal -- Pt reports last alcoholic drink was on 08/31/23 -- Pt reports intractable nausea and vomiting -- he was sent from [facility] outpatient rehab  -- he is being admitted to stepdown ICU -- alcohol withdrawal protocol ordered -- scheduled librium  taper ordered to try to avoid delirium tremens -- IV fluids ordered -- replete electrolytes as needed and follow them closely  -- Urine toxicology screen positive for opiates and THC   Hyponatremia - resolved  -- secondary to dehydration  -- IV fluids ordered to rehydrate  -- secondary to fluid losses    Hypokalemia - improving -- suspect he was total body depleted, remains low at 2.8 but improved -- additional IV potassium replacement ordered -- magnesium  repleted  -- recheck in AM and replete as needed    Atypical Chest Pain - RESOLVED  -- suspect secondary to gastritis  and esophagitis -- IV pantoprazole  ordered for GI protection given his history of Barrett's esophagus -- symptom management  -- ACS ruled out given reassuring high sensitive troponin tests   Essential Hypertension  -- resumed home meds   Lactic acidosis - RESOLVED  -- treating with IV fluid hydration  -- follow to resolution   Tobacco -- nicotine  patch ordered for nicotine  cravings -- counseled on cessation but patient says he is focusing on alcohol cessation at this time   Epilepsy -- resumed home antiepileptics   -- seizure precautions, especially in setting of alcohol withdrawal   Homelessness -- TOC met with him and provided him with local shelter resources on 09/01/23   DVT prophylaxis: enoxaparin   Code Status: Full  Family Communication:  Disposition: plan to discharge tomorrow    Consultants:   Procedures:   Antimicrobials:    Subjective: Pt starting to feel better, chest pain resolved, tolerating soft diet so far, no shaking or acute alcohol withdrawal symptoms. Pt says he may be homeless and would like to speak with Child psychotherapist.   Objective: Vitals:   09/01/23 0800 09/01/23 0900 09/01/23 1000 09/01/23 1135  BP: (!) 151/90 (!) 104/57 (!) 108/53 (!) 132/92  Pulse: 84 77 83 65  Resp: 20 11 18 18   Temp:    98.4 F (36.9 C)  TempSrc:    Oral  SpO2: 99% 97% 93% 98%  Weight:      Height:        Intake/Output Summary (Last 24 hours) at 09/01/2023 1251 Last data  filed at 09/01/2023 0751 Gross per 24 hour  Intake 3259.25 ml  Output 350 ml  Net 2909.25 ml   Filed Weights   08/31/23 1030 08/31/23 1523  Weight: 80.2 kg 78.3 kg   Examination:  General exam: Appears calm and comfortable  Respiratory system: Clear to auscultation. Respiratory effort normal. Cardiovascular system: normal S1 & S2 heard. No JVD, murmurs, rubs, gallops or clicks. No pedal edema. Gastrointestinal system: Abdomen is nondistended, soft and nontender. No organomegaly or masses felt.  Normal bowel sounds heard. Central nervous system: Alert and oriented. No focal neurological deficits. Extremities: Symmetric 5 x 5 power. Skin: No rashes, lesions or ulcers. Psychiatry: Judgement and insight appear normal. Mood & affect appropriate.   Data Reviewed: I have personally reviewed following labs and imaging studies  CBC: Recent Labs  Lab 08/31/23 1042  WBC 6.0  HGB 13.4  HCT 37.7*  MCV 98.7  PLT 210    Basic Metabolic Panel: Recent Labs  Lab 08/31/23 1042 08/31/23 1211 09/01/23 0418  NA 132*  --  136  K 2.2*  --  2.8*  CL 78*  --  88*  CO2 34*  --  35*  GLUCOSE 118*  --  99  BUN 9  --  10  CREATININE 0.95  --  0.90  CALCIUM 8.6*  --  8.5*  MG  --  1.7 2.2  PHOS  --   --  3.9    CBG: No results for input(s): GLUCAP in the last 168 hours.  Recent Results (from the past 240 hours)  MRSA Next Gen by PCR, Nasal     Status: None   Collection Time: 08/31/23  3:50 PM   Specimen: Nasal Mucosa; Nasal Swab  Result Value Ref Range Status   MRSA by PCR Next Gen NOT DETECTED NOT DETECTED Final    Comment: (NOTE) The GeneXpert MRSA Assay (FDA approved for NASAL specimens only), is one component of a comprehensive MRSA colonization surveillance program. It is not intended to diagnose MRSA infection nor to guide or monitor treatment for MRSA infections. Test performance is not FDA approved in patients less than 78 years old. Performed at [facility], [address], [place], [place] [address]      Radiology Studies: CT Angio Chest/Abd/Pel for Dissection W and/or Wo Contrast Result Date: 08/31/2023 CLINICAL DATA:  Acute aortic syndrome (AAS) suspected. Chest pain. Right upper quadrant abdominal pain. Shortness of breath. EXAM: CT ANGIOGRAPHY CHEST, ABDOMEN AND PELVIS TECHNIQUE: Non-contrast CT of the chest was initially obtained. Multidetector CT imaging through the chest, abdomen and pelvis was performed using the standard protocol during bolus administration  of intravenous contrast. Multiplanar reconstructed images and MIPs were obtained and reviewed to evaluate the vascular anatomy. RADIATION DOSE REDUCTION: This exam was performed according to the departmental dose-optimization program which includes automated exposure control, adjustment of the mA and/or kV according to patient size and/or use of iterative reconstruction technique. CONTRAST:  OMNIPAQUE  IOHEXOL  350 MG/ML SOLN COMPARISON:  CT scan abdomen and pelvis from 11/21/2016. CT angiography chest report from 02/18/2019; however, please note, images are not available for review. FINDINGS: CTA CHEST FINDINGS Cardiovascular: No intramural hematoma noted in the thoracic aorta on the unenhanced images. Thoracic aorta is normal in caliber without aneurysm, dissection, vasculitis or significant stenosis. Normal cardiac size. No pericardial effusion. No aortic aneurysm. There are coronary artery calcifications, in keeping with coronary artery disease. There are also mild peripheral atherosclerotic vascular calcifications of thoracic aorta and its major branches.  Mediastinum/Nodes: Visualized thyroid gland appears grossly unremarkable. No solid / cystic mediastinal masses. The esophagus is nondistended precluding optimal assessment. There is moderate circumferential thickening of the lower thoracic esophagus, which is most likely seen in the settings of chronic gastroesophageal reflux disease versus esophagitis. No axillary, mediastinal or hilar lymphadenopathy by size criteria. Lungs/Pleura: The central tracheo-bronchial tree is patent. No mass or consolidation. No pleural effusion or pneumothorax. No suspicious lung nodules. Musculoskeletal: The visualized soft tissues of the chest wall are grossly unremarkable. No suspicious osseous lesions. There are mild multilevel degenerative changes in the visualized spine. Review of the MIP images confirms the above findings. CTA ABDOMEN AND PELVIS FINDINGS VASCULAR Aorta:  Normal caliber aorta without aneurysm, dissection, vasculitis or significant stenosis. There are moderate peripheral atherosclerotic vascular calcifications of the aorta and its major branches. Celiac: Diminutive. Replaced right hepatic artery arising from the superior mesenteric artery. Celiac artery is otherwise patent without evidence of aneurysm, dissection, vasculitis or significant stenosis. SMA: Patent without evidence of aneurysm, dissection, vasculitis or significant stenosis. Renals: Both renal arteries are patent without evidence of aneurysm, dissection, vasculitis, fibromuscular dysplasia or significant stenosis. IMA: Patent without evidence of aneurysm, dissection, vasculitis or significant stenosis. Inflow: Patent without evidence of aneurysm, dissection, vasculitis or significant stenosis. Veins: No obvious venous abnormality within the limitations of this arterial phase study. Review of the MIP images confirms the above findings. NON-VASCULAR Hepatobiliary: The liver is normal in size. Non-cirrhotic configuration. No suspicious mass. There is a peripheral/subcapsular hyperattenuating area in the right hepatic lobe, segment 6 measuring 9 x 13 mm with surrounding hyperattenuating halo, which may represent small hemangioma with arterial portal shunting. No intrahepatic or extrahepatic bile duct dilation. No calcified gallstones. Normal gallbladder wall thickness. No pericholecystic inflammatory changes. Pancreas: Unremarkable. No pancreatic ductal dilatation or surrounding inflammatory changes. Spleen: Within normal limits. No focal lesion. Adrenals/Urinary Tract: Adrenal glands are unremarkable. No suspicious renal mass. No hydronephrosis. No renal or ureteric calculi. Unremarkable urinary bladder. Stomach/Bowel: There is moderate sliding hiatal hernia. No disproportionate dilation of the small or large bowel loops. No evidence of abnormal bowel wall thickening or inflammatory changes. The appendix is  unremarkable. Vascular/Lymphatic: No ascites or pneumoperitoneum. No abdominal or pelvic lymphadenopathy, by size criteria. Reproductive: Normal size prostate. Symmetric seminal vesicles. Other: The visualized soft tissues and abdominal wall are unremarkable. Musculoskeletal: No suspicious osseous lesions. There are mild multilevel degenerative changes in the visualized spine. Review of the MIP images confirms the above findings. IMPRESSION: 1. No acute aortic syndrome. No intramural hematoma in the thoracic aorta. No thoracoabdominal aortic aneurysm, dissection, vasculitis or penetrating atherosclerotic ulcer. 2. No acute inflammatory process identified within the abdomen or pelvis. 3. There is a 9 x 13 mm hyperattenuating area in the right hepatic lobe, segment 6, which is favored to represent a hemangioma. 4. Multiple other nonacute observations, as described above. Aortic Atherosclerosis (ICD10-I70.0). Electronically Signed   By: Ree Molt M.D.   On: 08/31/2023 13:53   CT Head Wo Contrast Result Date: 08/31/2023 CLINICAL DATA:  Head trauma, repeat vomiting (Age 67-64y) Headache, new onset (Age >= 51y). EXAM: CT HEAD WITHOUT CONTRAST TECHNIQUE: Contiguous axial images were obtained from the base of the skull through the vertex without intravenous contrast. RADIATION DOSE REDUCTION: This exam was performed according to the departmental dose-optimization program which includes automated exposure control, adjustment of the mA and/or kV according to patient size and/or use of iterative reconstruction technique. COMPARISON:  CT scan head from 02/26/2022. FINDINGS: Brain: No evidence of  acute infarction, hemorrhage, hydrocephalus, extra-axial collection or mass lesion/mass effect. There is bilateral periventricular hypodensity, which is non-specific but most likely seen in the settings of microvascular ischemic changes. Minimal in extent. Otherwise normal appearance of brain parenchyma. Ventricles are normal.  Cerebral volume is age appropriate. Vascular: No hyperdense vessel or unexpected calcification. Intracranial arteriosclerosis. Skull: Normal. Negative for fracture or focal lesion. Sinuses/Orbits: No acute finding. Other: Visualized mastoid air cells are unremarkable. No mastoid effusion. IMPRESSION: *No acute intracranial abnormality. Electronically Signed   By: Ree Molt M.D.   On: 08/31/2023 13:43   DG Chest 2 View Result Date: 08/31/2023 CLINICAL DATA:  chest pain. EXAM: CHEST - 2 VIEW COMPARISON:  None Available. FINDINGS: Bilateral lung fields are clear. Bilateral costophrenic angles are clear. Normal cardio-mediastinal silhouette. No acute osseous abnormalities. The soft tissues are within normal limits. IMPRESSION: No active cardiopulmonary disease. Electronically Signed   By: Ree Molt M.D.   On: 08/31/2023 10:53    Scheduled Meds:  chlordiazePOXIDE   25 mg Oral TID   Followed by   NOREEN ON 09/02/2023] chlordiazePOXIDE   10 mg Oral TID   Followed by   NOREEN ON 09/04/2023] chlordiazePOXIDE   5 mg Oral TID   Chlorhexidine  Gluconate Cloth  6 each Topical Q0600   enoxaparin  (LOVENOX ) injection  40 mg Subcutaneous Q24H   feeding supplement  237 mL Oral BID BM   folic acid   1 mg Oral Daily   multivitamin with minerals  1 tablet Oral Daily   thiamine   100 mg Oral Daily   Or   thiamine   100 mg Intravenous Daily   Continuous Infusions:  lactated ringers  1,000 mL with potassium chloride  20 mEq infusion     potassium chloride  10 mEq (09/01/23 1123)     LOS: 1 day   Time spent: 56 mins  Sherrey North Vicci, MD How to contact the [facility] Attending or Consulting provider 7A - 7P or covering provider during after hours 7P -7A, for this patient?  Check the care team in [facility] and look for a) attending/consulting TRH provider listed and b) the TRH team listed Log into [email or web address] to find provider on call.  Locate the TRH provider you are looking for under [facility] and page to a  number that you can be directly reached. If you still have difficulty reaching the provider, please page the [facility] (Director on Call) for the Hospitalists listed on amion for assistance.  09/01/2023, 12:51 PM

## 2023-09-01 NOTE — TOC Initial Note (Signed)
 Transition of Care [facility]) - Initial/Assessment Note    Patient Details  Name: Jonathon Guerrero MRN: [id number] Date of Birth: [birth year]  Transition of Care [facility]) CM/SW Contact:    Mcarthur Saddie Kim, LCSW Phone Number: 09/01/2023, 8:02 AM  Clinical Narrative: Pt admitted due to hypokalemia. TOC received consult for substance abuse counseling. Pt reports he has been staying at a motel. He is unsure if he will be able to afford to return there at d/c. Shelter list provided to pt. He also states he plans to contact Housing Authority to work on housing. Pt receives SNAP benefits. LCSW noted no insurance and no PCP. Discussed [facility] referral and pt agreeable. Referral made.  Pt admits to heavy drinking for years. He has been going to [facility] for treatment. [facility] counselor was working on [employer] for pt. He states he plans to be in touch with counselor there to see if this can be arranged once d/c from hospital.                  Expected Discharge Plan:  (unsure) Barriers to Discharge: Continued Medical Work up   Patient Goals and CMS Choice Patient states their goals for this hospitalization and ongoing recovery are:: find housing   Choice offered to / list presented to : Patient [place] ownership interest in [facility].provided to::  (n/a)    Expected Discharge Plan and Services In-house Referral: Clinical Social Work     Living arrangements for the past 2 months: Hotel/Motel                                      Prior Living Arrangements/Services Living arrangements for the past 2 months: Hotel/Motel Lives with:: Self Patient language and need for interpreter reviewed:: Yes Do you feel safe going back to the place where you live?: Yes      Need for Family Participation in Patient Care: No (Comment)     Criminal Activity/Legal Involvement Pertinent to Current Situation/Hospitalization: No - Comment as needed  Activities of Daily Living    ADL Screening (condition at time of admission) Independently performs ADLs?: Yes (appropriate for developmental age) Is the patient deaf or have difficulty hearing?: No Does the patient have difficulty seeing, even when wearing glasses/contacts?: No Does the patient have difficulty concentrating, remembering, or making decisions?: No  Permission Sought/Granted                  Emotional Assessment     Affect (typically observed): Appropriate Orientation: : Oriented to Self, Oriented to Place, Oriented to  Time, Oriented to Situation Alcohol / Substance Use: Alcohol Use Psych Involvement: No (comment)  Admission diagnosis:  Alcohol withdrawal (HCC) [F10.939] Hypokalemia [E87.6] Intractable nausea and vomiting [R11.2] Vomiting, unspecified vomiting type, unspecified whether nausea present [R11.10] Patient Active Problem List   Diagnosis Date Noted   Intractable nausea and vomiting 08/31/2023   Hyponatremia 08/31/2023   Lactic acidosis 08/31/2023   Atypical chest pain 08/31/2023   Chronic alcohol abuse 02/27/2022   Hypokalemia 02/27/2022   Hypomagnesemia 02/27/2022   Macrocytic anemia 02/27/2022   Allergic rhinitis 02/27/2022   Alcohol withdrawal (HCC) 02/26/2022   Epilepsy (HCC) 06/29/2021   Tobacco abuse 06/29/2021   Essential hypertension 06/29/2021   Depression 06/29/2021   Barrett's esophagus without dysplasia 09/22/2010   Reflux esophagitis 09/22/2010   GERD (gastroesophageal reflux disease) 09/07/2010   PCP:  Pcp, No Pharmacy:   [facility] [address], [place] - [address] [place] [address] Phone: [phone] Fax: [phone]  [facility] [place], [place] - [address] [place] [address] Phone: [phone] Fax: [phone]     Social Drivers of Health (SDOH) Social History: SDOH Screenings   Food Insecurity: Food Insecurity Present (08/31/2023)  Housing: High Risk (08/31/2023)  Transportation  Needs: No Transportation Needs (08/31/2023)  Utilities: Not At Risk (08/31/2023)  Depression (PHQ2-9): Low Risk  (06/29/2021)  Tobacco Use: High Risk (08/31/2023)   SDOH Interventions:     Readmission Risk Interventions     No data to display

## 2023-09-02 DIAGNOSIS — I10 Essential (primary) hypertension: Secondary | ICD-10-CM

## 2023-09-02 LAB — BASIC METABOLIC PANEL WITH GFR
Anion gap: 9 (ref 5–15)
BUN: 10 mg/dL (ref 6–20)
CO2: 28 mmol/L (ref 22–32)
Calcium: 8.2 mg/dL — ABNORMAL LOW (ref 8.9–10.3)
Chloride: 100 mmol/L (ref 98–111)
Creatinine, Ser: 0.79 mg/dL (ref 0.61–1.24)
GFR, Estimated: >60 mL/min (ref >60)
Glucose, Bld: 106 mg/dL — ABNORMAL HIGH (ref 70–99)
Potassium: 3.3 mmol/L — ABNORMAL LOW (ref 3.5–5.1)
Sodium: 137 mmol/L (ref 135–145)

## 2023-09-02 LAB — MAGNESIUM: Magnesium: 1.5 mg/dL — ABNORMAL LOW (ref 1.7–2.4)

## 2023-09-02 MED ORDER — ALBUTEROL SULFATE HFA 108 (90 BASE) MCG/ACT IN AERS: 2.0000 | INHALATION_SPRAY | Freq: Four times a day (QID) | RESPIRATORY_TRACT | 1 refills | Status: AC | PRN

## 2023-09-02 MED ORDER — FOLIC ACID 1 MG PO TABS
1.0000 mg | ORAL_TABLET | Freq: Every day | ORAL | Status: AC
Start: 2023-09-03 — End: ?

## 2023-09-02 MED ORDER — POTASSIUM CHLORIDE ER 10 MEQ PO CPCR: 10.0000 meq | ORAL_CAPSULE | Freq: Every day | ORAL | 0 refills | Status: AC

## 2023-09-02 MED ORDER — OMEPRAZOLE 40 MG PO CPDR: 40.0000 mg | DELAYED_RELEASE_CAPSULE | Freq: Every day | ORAL | 1 refills | Status: AC

## 2023-09-02 MED ORDER — VITAMIN B-1 100 MG PO TABS: 100.0000 mg | ORAL_TABLET | Freq: Every day | ORAL | Status: AC

## 2023-09-02 MED ORDER — POTASSIUM CHLORIDE CRYS ER 20 MEQ PO TBCR
40.0000 meq | EXTENDED_RELEASE_TABLET | Freq: Once | ORAL | Status: AC
  Administered 2023-09-02: 40 meq via ORAL
  Filled 2023-09-02: qty 2

## 2023-09-02 MED ORDER — FLUTICASONE PROPIONATE 50 MCG/ACT NA SUSP
2.0000 | Freq: Every day | NASAL | Status: AC
Start: 2023-09-02 — End: ?

## 2023-09-02 MED ORDER — ADULT MULTIVITAMIN W/MINERALS CH: 1.0000 | ORAL_TABLET | Freq: Every day | ORAL | Status: AC

## 2023-09-02 MED ORDER — MAGNESIUM SULFATE 4 GM/100ML IV SOLN
4.0000 g | Freq: Once | INTRAVENOUS | Status: AC
  Administered 2023-09-02: 4 g via INTRAVENOUS
  Filled 2023-09-02: qty 100

## 2023-09-02 NOTE — Discharge Instructions (Signed)
 IMPORTANT INFORMATION: PAY CLOSE ATTENTION   PHYSICIAN DISCHARGE INSTRUCTIONS  Follow with Primary care provider  Pcp, No  and other consultants as instructed by your Hospitalist Physician  SEEK MEDICAL CARE OR RETURN TO EMERGENCY ROOM IF SYMPTOMS COME BACK, WORSEN OR NEW PROBLEM DEVELOPS   Please note: You were cared for by a hospitalist during your hospital stay. Every effort will be made to forward records to your primary care provider.  You can request that your primary care provider send for your hospital records if they have not received them.  Once you are discharged, your primary care physician will handle any further medical issues. Please note that NO REFILLS for any discharge medications will be authorized once you are discharged, as it is imperative that you return to your primary care physician (or establish a relationship with a primary care physician if you do not have one) for your post hospital discharge needs so that they can reassess your need for medications and monitor your lab values.  Please get a complete blood count and chemistry panel checked by your Primary MD at your next visit, and again as instructed by your Primary MD.  Get Medicines reviewed and adjusted: Please take all your medications with you for your next visit with your Primary MD  Laboratory/radiological data: Please request your Primary MD to go over all hospital tests and procedure/radiological results at the follow up, please ask your primary care provider to get all Hospital records sent to his/her office.  In some cases, they will be blood work, cultures and biopsy results pending at the time of your discharge. Please request that your primary care provider follow up on these results.  If you are diabetic, please bring your blood sugar readings with you to your follow up appointment with primary care.    Please call and make your follow up appointments as soon as possible.    Also Note the  following: If you experience worsening of your admission symptoms, develop shortness of breath, life threatening emergency, suicidal or homicidal thoughts you must seek medical attention immediately by calling 911 or calling your MD immediately  if symptoms less severe.  You must read complete instructions/literature along with all the possible adverse reactions/side effects for all the Medicines you take and that have been prescribed to you. Take any new Medicines after you have completely understood and accpet all the possible adverse reactions/side effects.   Do not drive when taking Pain medications or sleeping medications (Benzodiazepines)  Do not take more than prescribed Pain, Sleep and Anxiety Medications. It is not advisable to combine anxiety,sleep and pain medications without talking with your primary care practitioner  Special Instructions: If you have smoked or chewed Tobacco  in the last 2 yrs please stop smoking, stop any regular Alcohol  and or any Recreational drug use.  Wear Seat belts while driving.  Do not drive if taking any narcotic, mind altering or controlled substances or recreational drugs or alcohol.

## 2023-09-02 NOTE — Progress Notes (Signed)
 Patient refused to have bed alarm placed. Patient up and ambulating in the room without assistance.

## 2023-09-02 NOTE — Plan of Care (Signed)

## 2023-09-02 NOTE — Discharge Summary (Signed)
 Physician Discharge Summary  Jonathon Guerrero FMW:[id number] DOB: Aug 23, 1970 DOA: 08/31/2023  PCP: [facility]  Admit date: 08/31/2023 Discharge date: 09/02/2023  Disposition: Home   Recommendations for Outpatient Follow-up:  Follow up with PCP in 1-2 weeks Please obtain BMP/CBC in 1-2 weeks Please return to [facility] for ongoing alcohol abuse treatments  Home Health: NA  Discharge Condition: STABLE   CODE STATUS: FULL DIET: regular foods    Brief Hospitalization Summary: Please see all hospital notes, images, labs for full details of the hospitalization. Admission provider HPI:  53 year old male chronic heavy alcohol abuser, seizure disorder, GERD, Barrett's esophagus, h/o helicobacter gastritis, HTN, reported his last alcoholic drink was yesterday and he presented to the [facility] facility for alcohol withdrawal treatment.  Unfortunately he was experiencing severe nausea and vomiting and ataxia associated with frequent falling down and he was sent from [facility] to the ED.  He reports that he hit his head with a fall. He his having frequent hiccups and he is having chest discomfort and pain since he has been vomiting so frequently.  He denies having diarrhea. He was seen in ED today and noted to have hyponatremia, hypokalemia and low chloride levels consistent with his history of severe emesis.  He was given treatment in the ED and continued to be symptomatic and admission was requested for further management.    Hospital Course by listed problems addressed   Alcohol withdrawal -- Pt reports last alcoholic drink was on 08/31/23 -- Pt reports intractable nausea and vomiting - which is resolved now -- he was sent from [facility] outpatient rehab  -- he was admitted to stepdown ICU and then transferred to med surg -- alcohol withdrawal protocol ordered -- scheduled librium  taper ordered to try to avoid delirium tremens -- IV fluids ordered -- replete electrolytes as needed and follow them closely   -- Urine toxicology screen positive for opiates and THC -- He is much improved now and tolerating regular diet, no acute alcohol withdrawal symptoms -- DC home today and strongly advised him to return to [facility] for ongoing alcohol abuse treatments   Hyponatremia - resolved  -- secondary to dehydration  -- IV fluids ordered to rehydrate  -- secondary to fluid losses    Hypokalemia - improving -- suspect he was total body depleted, improved to 3.3 and additional K given with Mg -- additional IV potassium replacement ordered -- magnesium  repleted   Hypomangnesemia -- IV replacement given   Atypical Chest Pain - RESOLVED  -- suspect secondary to gastritis and esophagitis -- IV pantoprazole  ordered for GI protection given his history of Barrett's esophagus -- symptom management  -- ACS ruled out given reassuring high sensitive troponin tests   Essential Hypertension  -- resumed home meds   Lactic acidosis - RESOLVED  -- treating with IV fluid hydration  -- follow to resolution   Tobacco -- nicotine  patch ordered for nicotine  cravings -- counseled on cessation but patient says he is focusing on alcohol cessation at this time   Epilepsy -- resumed home antiepileptics   -- seizure precautions, especially in setting of alcohol withdrawal    Homelessness -- TOC met with him and provided him with local shelter resources on 09/01/23     Discharge Diagnoses:  Principal Problem:   Intractable nausea and vomiting Active Problems:   Alcohol withdrawal (HCC)   Chronic alcohol abuse   Atypical chest pain   GERD (gastroesophageal reflux disease)   Barrett's esophagus without dysplasia   Reflux  esophagitis   Epilepsy (HCC)   Tobacco abuse   Essential hypertension   Depression   Hypokalemia   Hypomagnesemia   Macrocytic anemia   Hyponatremia   Lactic acidosis   Discharge Instructions:  Allergies as of 09/02/2023       Reactions   Poison Sumac Extract Itching, Swelling,  Dermatitis, Rash   Sardine Flavor Other (See Comments)   Unknown    Shellfish Allergy Hives        Medication List     TAKE these medications    albuterol  108 (90 Base) MCG/ACT inhaler Commonly known as: VENTOLIN  HFA Inhale 2 puffs into the lungs every 6 (six) hours as needed for wheezing or shortness of breath.   cetirizine 10 MG tablet Commonly known as: ZYRTEC Take 10 mg by mouth daily.   divalproex  500 MG 24 hr tablet Commonly known as: DEPAKOTE  ER Take 500 mg by mouth in the morning and at bedtime.   fluticasone  50 MCG/ACT nasal spray Commonly known as: FLONASE  Place 2 sprays into both nostrils daily.   folic acid  1 MG tablet Commonly known as: FOLVITE  Take 1 tablet (1 mg total) by mouth daily. Start taking on: September 03, 2023   gabapentin  100 MG capsule Commonly known as: NEURONTIN  Take 1 capsule (100 mg total) by mouth 3 (three) times daily.   magnesium  oxide 400 (240 Mg) MG tablet Commonly known as: MAG-OX Take 400 mg by mouth 2 (two) times daily.   melatonin 3 MG Tabs tablet Take 3 mg by mouth at bedtime.   multivitamin with minerals Tabs tablet Take 1 tablet by mouth daily. Start taking on: September 03, 2023   omeprazole  40 MG capsule Commonly known as: PRILOSEC Take 1 capsule (40 mg total) by mouth daily.   potassium chloride  10 MEQ CR capsule Commonly known as: MICRO-K  Take 1 capsule (10 mEq total) by mouth daily. Start taking on: September 03, 2023   thiamine  100 MG tablet Commonly known as: Vitamin B-1 Take 1 tablet (100 mg total) by mouth daily. Start taking on: September 03, 2023        Follow-up Information     [facility] Follow up.   Why: To establish primary care provider. Contact information: [phone]               Allergies  Allergen Reactions   Poison Sumac Extract Itching, Swelling, Dermatitis and Rash   Sardine Flavor Other (See Comments)    Unknown    Shellfish Allergy Hives   Allergies as of 09/02/2023        Reactions   Poison Sumac Extract Itching, Swelling, Dermatitis, Rash   Sardine Flavor Other (See Comments)   Unknown    Shellfish Allergy Hives        Medication List     TAKE these medications    albuterol  108 (90 Base) MCG/ACT inhaler Commonly known as: VENTOLIN  HFA Inhale 2 puffs into the lungs every 6 (six) hours as needed for wheezing or shortness of breath.   cetirizine 10 MG tablet Commonly known as: ZYRTEC Take 10 mg by mouth daily.   divalproex  500 MG 24 hr tablet Commonly known as: DEPAKOTE  ER Take 500 mg by mouth in the morning and at bedtime.   fluticasone  50 MCG/ACT nasal spray Commonly known as: FLONASE  Place 2 sprays into both nostrils daily.   folic acid  1 MG tablet Commonly known as: FOLVITE  Take 1 tablet (1 mg total) by mouth daily. Start taking on: September 03, 2023  gabapentin  100 MG capsule Commonly known as: NEURONTIN  Take 1 capsule (100 mg total) by mouth 3 (three) times daily.   magnesium  oxide 400 (240 Mg) MG tablet Commonly known as: MAG-OX Take 400 mg by mouth 2 (two) times daily.   melatonin 3 MG Tabs tablet Take 3 mg by mouth at bedtime.   multivitamin with minerals Tabs tablet Take 1 tablet by mouth daily. Start taking on: September 03, 2023   omeprazole  40 MG capsule Commonly known as: PRILOSEC Take 1 capsule (40 mg total) by mouth daily.   potassium chloride  10 MEQ CR capsule Commonly known as: MICRO-K  Take 1 capsule (10 mEq total) by mouth daily. Start taking on: September 03, 2023   thiamine  100 MG tablet Commonly known as: Vitamin B-1 Take 1 tablet (100 mg total) by mouth daily. Start taking on: September 03, 2023        Procedures/Studies: CT Angio Chest/Abd/Pel for Dissection W and/or Wo Contrast Result Date: 08/31/2023 CLINICAL DATA:  Acute aortic syndrome (AAS) suspected. Chest pain. Right upper quadrant abdominal pain. Shortness of breath. EXAM: CT ANGIOGRAPHY CHEST, ABDOMEN AND PELVIS TECHNIQUE: Non-contrast  CT of the chest was initially obtained. Multidetector CT imaging through the chest, abdomen and pelvis was performed using the standard protocol during bolus administration of intravenous contrast. Multiplanar reconstructed images and MIPs were obtained and reviewed to evaluate the vascular anatomy. RADIATION DOSE REDUCTION: This exam was performed according to the departmental dose-optimization program which includes automated exposure control, adjustment of the mA and/or kV according to patient size and/or use of iterative reconstruction technique. CONTRAST:  OMNIPAQUE  IOHEXOL  350 MG/ML SOLN COMPARISON:  CT scan abdomen and pelvis from 11/21/2016. CT angiography chest report from 02/18/2019; however, please note, images are not available for review. FINDINGS: CTA CHEST FINDINGS Cardiovascular: No intramural hematoma noted in the thoracic aorta on the unenhanced images. Thoracic aorta is normal in caliber without aneurysm, dissection, vasculitis or significant stenosis. Normal cardiac size. No pericardial effusion. No aortic aneurysm. There are coronary artery calcifications, in keeping with coronary artery disease. There are also mild peripheral atherosclerotic vascular calcifications of thoracic aorta and its major branches. Mediastinum/Nodes: Visualized thyroid gland appears grossly unremarkable. No solid / cystic mediastinal masses. The esophagus is nondistended precluding optimal assessment. There is moderate circumferential thickening of the lower thoracic esophagus, which is most likely seen in the settings of chronic gastroesophageal reflux disease versus esophagitis. No axillary, mediastinal or hilar lymphadenopathy by size criteria. Lungs/Pleura: The central tracheo-bronchial tree is patent. No mass or consolidation. No pleural effusion or pneumothorax. No suspicious lung nodules. Musculoskeletal: The visualized soft tissues of the chest wall are grossly unremarkable. No suspicious osseous lesions.  There are mild multilevel degenerative changes in the visualized spine. Review of the MIP images confirms the above findings. CTA ABDOMEN AND PELVIS FINDINGS VASCULAR Aorta: Normal caliber aorta without aneurysm, dissection, vasculitis or significant stenosis. There are moderate peripheral atherosclerotic vascular calcifications of the aorta and its major branches. Celiac: Diminutive. Replaced right hepatic artery arising from the superior mesenteric artery. Celiac artery is otherwise patent without evidence of aneurysm, dissection, vasculitis or significant stenosis. SMA: Patent without evidence of aneurysm, dissection, vasculitis or significant stenosis. Renals: Both renal arteries are patent without evidence of aneurysm, dissection, vasculitis, fibromuscular dysplasia or significant stenosis. IMA: Patent without evidence of aneurysm, dissection, vasculitis or significant stenosis. Inflow: Patent without evidence of aneurysm, dissection, vasculitis or significant stenosis. Veins: No obvious venous abnormality within the limitations of this arterial phase study. Review of  the MIP images confirms the above findings. NON-VASCULAR Hepatobiliary: The liver is normal in size. Non-cirrhotic configuration. No suspicious mass. There is a peripheral/subcapsular hyperattenuating area in the right hepatic lobe, segment 6 measuring 9 x 13 mm with surrounding hyperattenuating halo, which may represent small hemangioma with arterial portal shunting. No intrahepatic or extrahepatic bile duct dilation. No calcified gallstones. Normal gallbladder wall thickness. No pericholecystic inflammatory changes. Pancreas: Unremarkable. No pancreatic ductal dilatation or surrounding inflammatory changes. Spleen: Within normal limits. No focal lesion. Adrenals/Urinary Tract: Adrenal glands are unremarkable. No suspicious renal mass. No hydronephrosis. No renal or ureteric calculi. Unremarkable urinary bladder. Stomach/Bowel: There is moderate  sliding hiatal hernia. No disproportionate dilation of the small or large bowel loops. No evidence of abnormal bowel wall thickening or inflammatory changes. The appendix is unremarkable. Vascular/Lymphatic: No ascites or pneumoperitoneum. No abdominal or pelvic lymphadenopathy, by size criteria. Reproductive: Normal size prostate. Symmetric seminal vesicles. Other: The visualized soft tissues and abdominal wall are unremarkable. Musculoskeletal: No suspicious osseous lesions. There are mild multilevel degenerative changes in the visualized spine. Review of the MIP images confirms the above findings. IMPRESSION: 1. No acute aortic syndrome. No intramural hematoma in the thoracic aorta. No thoracoabdominal aortic aneurysm, dissection, vasculitis or penetrating atherosclerotic ulcer. 2. No acute inflammatory process identified within the abdomen or pelvis. 3. There is a 9 x 13 mm hyperattenuating area in the right hepatic lobe, segment 6, which is favored to represent a hemangioma. 4. Multiple other nonacute observations, as described above. Aortic Atherosclerosis (ICD10-I70.0). Electronically Signed   By: Ree Molt M.D.   On: 08/31/2023 13:53   CT Head Wo Contrast Result Date: 08/31/2023 CLINICAL DATA:  Head trauma, repeat vomiting (Age 64-64y) Headache, new onset (Age >= 51y). EXAM: CT HEAD WITHOUT CONTRAST TECHNIQUE: Contiguous axial images were obtained from the base of the skull through the vertex without intravenous contrast. RADIATION DOSE REDUCTION: This exam was performed according to the departmental dose-optimization program which includes automated exposure control, adjustment of the mA and/or kV according to patient size and/or use of iterative reconstruction technique. COMPARISON:  CT scan head from 02/26/2022. FINDINGS: Brain: No evidence of acute infarction, hemorrhage, hydrocephalus, extra-axial collection or mass lesion/mass effect. There is bilateral periventricular hypodensity, which is  non-specific but most likely seen in the settings of microvascular ischemic changes. Minimal in extent. Otherwise normal appearance of brain parenchyma. Ventricles are normal. Cerebral volume is age appropriate. Vascular: No hyperdense vessel or unexpected calcification. Intracranial arteriosclerosis. Skull: Normal. Negative for fracture or focal lesion. Sinuses/Orbits: No acute finding. Other: Visualized mastoid air cells are unremarkable. No mastoid effusion. IMPRESSION: *No acute intracranial abnormality. Electronically Signed   By: Ree Molt M.D.   On: 08/31/2023 13:43   DG Chest 2 View Result Date: 08/31/2023 CLINICAL DATA:  chest pain. EXAM: CHEST - 2 VIEW COMPARISON:  None Available. FINDINGS: Bilateral lung fields are clear. Bilateral costophrenic angles are clear. Normal cardio-mediastinal silhouette. No acute osseous abnormalities. The soft tissues are within normal limits. IMPRESSION: No active cardiopulmonary disease. Electronically Signed   By: Ree Molt M.D.   On: 08/31/2023 10:53     Subjective: Pt tolerating diet well, no complaints, says he will live with brother.   Discharge Exam: Vitals:   09/01/23 1135 09/02/23 0536  BP: (!) 132/92 (!) 160/84  Pulse: 65 (!) 57  Resp: 18 17  Temp: 98.4 F (36.9 C) 98.3 F (36.8 C)  SpO2: 98% 100%   Vitals:   09/01/23 0900 09/01/23 1000 09/01/23 1135  09/02/23 0536  BP: (!) 104/57 (!) 108/53 (!) 132/92 (!) 160/84  Pulse: 77 83 65 (!) 57  Resp: 11 18 18 17   Temp:   98.4 F (36.9 C) 98.3 F (36.8 C)  TempSrc:   Oral Oral  SpO2: 97% 93% 98% 100%  Weight:      Height:       General: Pt is alert, awake, not in acute distress Cardiovascular: normal S1/S2 +, no rubs, no gallops Respiratory: CTA bilaterally, no wheezing, no rhonchi Abdominal: Soft, NT, ND, bowel sounds + Extremities: no edema, no cyanosis   The results of significant diagnostics from this hospitalization (including imaging, microbiology, ancillary and  laboratory) are listed below for reference.     Microbiology: Recent Results (from the past 240 hours)  MRSA Next Gen by PCR, Nasal     Status: None   Collection Time: 08/31/23  3:50 PM   Specimen: Nasal Mucosa; Nasal Swab  Result Value Ref Range Status   MRSA by PCR Next Gen NOT DETECTED NOT DETECTED Final    Comment: (NOTE) The GeneXpert MRSA Assay (FDA approved for NASAL specimens only), is one component of a comprehensive MRSA colonization surveillance program. It is not intended to diagnose MRSA infection nor to guide or monitor treatment for MRSA infections. Test performance is not FDA approved in patients less than 72 years old. Performed at [facility], [address], [place], [place] [address]      Labs: BNP (last 3 results) No results for input(s): BNP in the last 8760 hours. Basic Metabolic Panel: Recent Labs  Lab 08/31/23 1042 08/31/23 1211 09/01/23 0418 09/01/23 1342 09/02/23 0434  NA 132*  --  136 137 137  K 2.2*  --  2.8* 3.1* 3.3*  CL 78*  --  88* 92* 100  CO2 34*  --  35* 31 28  GLUCOSE 118*  --  99 144* 106*  BUN 9  --  10 9 10   CREATININE 0.95  --  0.90 1.05 0.79  CALCIUM 8.6*  --  8.5* 8.4* 8.2*  MG  --  1.7 2.2  --  1.5*  PHOS  --   --  3.9  --   --    Liver Function Tests: Recent Labs  Lab 08/31/23 1211  AST 26  ALT 9  ALKPHOS 100  BILITOT 1.2  PROT 6.7  ALBUMIN 3.6   Recent Labs  Lab 08/31/23 1211  LIPASE 35   No results for input(s): AMMONIA in the last 168 hours. CBC: Recent Labs  Lab 08/31/23 1042  WBC 6.0  HGB 13.4  HCT 37.7*  MCV 98.7  PLT 210   Cardiac Enzymes: No results for input(s): CKTOTAL, CKMB, CKMBINDEX, TROPONINI in the last 168 hours. BNP: Invalid input(s): POCBNP CBG: No results for input(s): GLUCAP in the last 168 hours. D-Dimer No results for input(s): DDIMER in the last 72 hours. Hgb A1c No results for input(s): HGBA1C in the last 72 hours. Lipid Profile No results for  input(s): CHOL, HDL, LDLCALC, TRIG, CHOLHDL, LDLDIRECT in the last 72 hours. Thyroid function studies No results for input(s): TSH, T4TOTAL, T3FREE, THYROIDAB in the last 72 hours.  Invalid input(s): FREET3 Anemia work up No results for input(s): VITAMINB12, FOLATE, FERRITIN, TIBC, IRON, RETICCTPCT in the last 72 hours. Urinalysis    Component Value Date/Time   COLORURINE YELLOW 09/01/2010 1626   APPEARANCEUR CLEAR 09/01/2010 1626   LABSPEC 1.015 09/01/2010 1626   PHURINE 8.5 (H) 09/01/2010 1626   GLUCOSEU  NEGATIVE 09/01/2010 1626   HGBUR NEGATIVE 09/01/2010 1626   BILIRUBINUR NEGATIVE 09/01/2010 1626   KETONESUR NEGATIVE 09/01/2010 1626   PROTEINUR NEGATIVE 09/01/2010 1626   UROBILINOGEN 1.0 09/01/2010 1626   NITRITE NEGATIVE 09/01/2010 1626   LEUKOCYTESUR NEGATIVE 09/01/2010 1626   Sepsis Labs Recent Labs  Lab 08/31/23 1042  WBC 6.0   Microbiology Recent Results (from the past 240 hours)  MRSA Next Gen by PCR, Nasal     Status: None   Collection Time: 08/31/23  3:50 PM   Specimen: Nasal Mucosa; Nasal Swab  Result Value Ref Range Status   MRSA by PCR Next Gen NOT DETECTED NOT DETECTED Final    Comment: (NOTE) The GeneXpert MRSA Assay (FDA approved for NASAL specimens only), is one component of a comprehensive MRSA colonization surveillance program. It is not intended to diagnose MRSA infection nor to guide or monitor treatment for MRSA infections. Test performance is not FDA approved in patients less than 36 years old. Performed at [facility], [address], [place], [place] [address]    Time coordinating discharge: 33 mins  SIGNED:  Afton Louder, MD  [facility] 09/02/2023, 10:02 AM How to contact the [facility] Attending or Consulting provider 7A - 7P or covering provider during after hours 7P -7A, for this patient?  Check the care team in [facility] and look for a) attending/consulting [facility] provider listed and b) the [facility] team  listed Log into [email or web address] and use [facility] universal password to access. If you do not have the password, please contact the hospital operator. Locate the [facility] provider you are looking for under [facility] and page to a number that you can be directly reached. If you still have difficulty reaching the provider, please page the [facility] (Director on Call) for the Hospitalists listed on amion for assistance.

## 2023-09-11 ENCOUNTER — Telehealth: Payer: Self-pay

## 2023-09-11 NOTE — Telephone Encounter (Signed)
 Followed with pt and he confirmed he was approved for [facility]
